# Patient Record
Sex: Female | Born: 1975 | Race: White | Hispanic: No | Marital: Married | Smoking: Current every day smoker
Health system: Southern US, Community
[De-identification: ages and names within clinical notes are randomized; demographics above are authoritative.]

## PROBLEM LIST (undated history)

## (undated) DIAGNOSIS — R51 Headache: Secondary | ICD-10-CM

---

## 2000-05-16 HISTORY — PX: ABDOMINAL HYSTERECTOMY: SHX81

## 2002-04-08 ENCOUNTER — Inpatient Hospital Stay (HOSPITAL_COMMUNITY): Admission: RE | Admit: 2002-04-08 | Discharge: 2002-04-10 | Payer: Self-pay | Admitting: Obstetrics and Gynecology

## 2002-10-04 ENCOUNTER — Ambulatory Visit (HOSPITAL_COMMUNITY): Admission: RE | Admit: 2002-10-04 | Discharge: 2002-10-04 | Payer: Self-pay | Admitting: Internal Medicine

## 2002-10-04 ENCOUNTER — Encounter: Payer: Self-pay | Admitting: Internal Medicine

## 2004-02-17 ENCOUNTER — Ambulatory Visit (HOSPITAL_COMMUNITY): Admission: RE | Admit: 2004-02-17 | Discharge: 2004-02-17 | Payer: Self-pay | Admitting: Preventative Medicine

## 2004-11-22 ENCOUNTER — Emergency Department (HOSPITAL_COMMUNITY): Admission: EM | Admit: 2004-11-22 | Discharge: 2004-11-22 | Payer: Self-pay | Admitting: Emergency Medicine

## 2004-11-25 ENCOUNTER — Inpatient Hospital Stay (HOSPITAL_COMMUNITY): Admission: AD | Admit: 2004-11-25 | Discharge: 2004-11-29 | Payer: Self-pay | Admitting: Internal Medicine

## 2004-11-28 ENCOUNTER — Ambulatory Visit: Payer: Self-pay | Admitting: Orthopedic Surgery

## 2004-12-24 ENCOUNTER — Ambulatory Visit (HOSPITAL_COMMUNITY): Admission: RE | Admit: 2004-12-24 | Discharge: 2004-12-24 | Payer: Self-pay | Admitting: Internal Medicine

## 2005-02-11 ENCOUNTER — Ambulatory Visit (HOSPITAL_COMMUNITY): Admission: RE | Admit: 2005-02-11 | Discharge: 2005-02-11 | Payer: Self-pay | Admitting: *Deleted

## 2005-10-13 ENCOUNTER — Ambulatory Visit: Payer: Self-pay | Admitting: Orthopedic Surgery

## 2005-10-19 ENCOUNTER — Encounter: Admission: RE | Admit: 2005-10-19 | Discharge: 2005-10-19 | Payer: Self-pay | Admitting: Orthopedic Surgery

## 2005-11-04 ENCOUNTER — Encounter: Admission: RE | Admit: 2005-11-04 | Discharge: 2005-11-04 | Payer: Self-pay | Admitting: Orthopedic Surgery

## 2009-12-31 ENCOUNTER — Emergency Department (HOSPITAL_COMMUNITY): Admission: EM | Admit: 2009-12-31 | Discharge: 2009-12-31 | Payer: Self-pay | Admitting: Emergency Medicine

## 2010-01-28 ENCOUNTER — Emergency Department (HOSPITAL_COMMUNITY): Admission: EM | Admit: 2010-01-28 | Discharge: 2010-01-29 | Payer: Self-pay | Admitting: Emergency Medicine

## 2010-06-06 ENCOUNTER — Encounter: Payer: Self-pay | Admitting: Orthopedic Surgery

## 2010-07-29 LAB — COMPREHENSIVE METABOLIC PANEL
ALT: 11 U/L (ref 0–35)
AST: 18 U/L (ref 0–37)
Albumin: 4.2 g/dL (ref 3.5–5.2)
Alkaline Phosphatase: 119 U/L — ABNORMAL HIGH (ref 39–117)
BUN: 8 mg/dL (ref 6–23)
CO2: 21 mEq/L (ref 19–32)
Calcium: 9.4 mg/dL (ref 8.4–10.5)
Chloride: 105 mEq/L (ref 96–112)
Creatinine, Ser: 0.77 mg/dL (ref 0.4–1.2)
GFR calc Af Amer: >60 mL/min (ref >60)
GFR calc non Af Amer: >60 mL/min (ref >60)
Glucose, Bld: 93 mg/dL (ref 70–99)
Potassium: 3.4 mEq/L — ABNORMAL LOW (ref 3.5–5.1)
Sodium: 138 mEq/L (ref 135–145)
Total Bilirubin: 0.9 mg/dL (ref 0.3–1.2)
Total Protein: 8.1 g/dL (ref 6.0–8.3)

## 2010-07-29 LAB — CBC
HCT: 41 % (ref 36.0–46.0)
Hemoglobin: 14.1 g/dL (ref 12.0–15.0)
MCH: 31.1 pg (ref 26.0–34.0)
MCHC: 34.5 g/dL (ref 30.0–36.0)
MCV: 90.3 fL (ref 78.0–100.0)
Platelets: 332 10*3/uL (ref 150–400)
RBC: 4.53 MIL/uL (ref 3.87–5.11)
RDW: 12.4 % (ref 11.5–15.5)
WBC: 14.7 10*3/uL — ABNORMAL HIGH (ref 4.0–10.5)

## 2010-07-29 LAB — D-DIMER, QUANTITATIVE: D-Dimer, Quant: 0.43 ug/mL-FEU (ref 0.00–0.48)

## 2010-07-29 LAB — POCT CARDIAC MARKERS: Myoglobin, poc: 67 ng/mL (ref 12–200)

## 2010-07-29 LAB — URINALYSIS, ROUTINE W REFLEX MICROSCOPIC
Bilirubin Urine: NEGATIVE
Glucose, UA: NEGATIVE mg/dL
Hgb urine dipstick: NEGATIVE
Nitrite: NEGATIVE
Protein, ur: NEGATIVE mg/dL
Specific Gravity, Urine: 1.015 (ref 1.005–1.030)
Urobilinogen, UA: 4 mg/dL — ABNORMAL HIGH (ref 0.0–1.0)
pH: 7 (ref 5.0–8.0)

## 2010-07-29 LAB — LACTIC ACID, PLASMA: Lactic Acid, Venous: 1.6 mmol/L (ref 0.5–2.2)

## 2010-07-29 LAB — DIFFERENTIAL
Basophils Absolute: 0.1 10*3/uL (ref 0.0–0.1)
Basophils Relative: 1 % (ref 0–1)
Eosinophils Absolute: 0 10*3/uL (ref 0.0–0.7)
Eosinophils Relative: 0 % (ref 0–5)
Lymphocytes Relative: 21 % (ref 12–46)
Lymphs Abs: 3.1 10*3/uL (ref 0.7–4.0)
Monocytes Absolute: 1 10*3/uL (ref 0.1–1.0)
Monocytes Relative: 7 % (ref 3–12)
Neutro Abs: 10.5 10*3/uL — ABNORMAL HIGH (ref 1.7–7.7)
Neutrophils Relative %: 71 % (ref 43–77)

## 2010-07-29 LAB — CULTURE, BLOOD (ROUTINE X 2)
Culture  Setup Time: 201109160136
Culture  Setup Time: 201109160137
Culture: NO GROWTH
Culture: NO GROWTH

## 2010-07-29 LAB — GLUCOSE, CAPILLARY: Glucose-Capillary: 103 mg/dL — ABNORMAL HIGH (ref 70–99)
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
## 2010-10-01 NOTE — Op Note (Signed)
NAMEEarl Jarvis                        ACCOUNT NO.:  [id number]   MEDICAL RECORD NO.:  [id number]                   PATIENT TYPE:  INP   LOCATION:  A417                                 FACILITY:  [facility]   PHYSICIAN:  Tilda Burrow, M.D.              DATE OF BIRTH:  1975/12/05   DATE OF PROCEDURE:  04/08/2002  DATE OF DISCHARGE:  04/10/2002                                 OPERATIVE REPORT   PREOPERATIVE DIAGNOSES:  1. Desire for elective permanent sterilization.  2. Dysmenorrhea.  3. Dyspareunia.  4. Cervical dysplasia.   POSTOPERATIVE DIAGNOSES:  1. Desire for elective permanent sterilization.  2. Dysmenorrhea.  3. Dyspareunia.  4. Cervical dysplasia.   PROCEDURE:  Vaginal hysterectomy.   SURGEON:  Christin Bach, M.D.   ASSISTANTSCurley Spice and Amie Critchley.   ANESTHESIA:  General   COMPLICATIONS:  None.   ESTIMATED BLOOD LOSS:  Less than 100 mL   FINDINGS:  Small uterus, no pelvic adhesions.   DETAILS OF PROCEDURE:  The patient was taken to the operating room and  prepped and draped int he usual fashion for vaginal procedure.  The legs  were elevated in cellophane candy cane support stirrups.  The posterior  vagina had a 3-inch weighted speculum then placed for visibility.  The  cervix was grasped with a Lahey thyroid tenaculum; the anterior cervical  vaginal fornix opened from 10 o'clock to 2 o'clock and the bladder elevated  off of the lower uterine segment.  The posterior colpotomy incision was then  performed followed by using a curved Zeppelin clamp to cross clamp the  uterosacral ligaments on each side with transection and #0 chromic suture  ligature performed followed by tagging of these pedicles.   The lower cardinal ligaments were then taken down, clamped, cut, and suture  ligated on each side without difficulty.  The upper cardinal ligaments were  similarly treated.  The vesicouterine reflection of peritoneum could be  identified at this time and  opened and then entered.  The bladder was  elevated upwards using a Deaver retractor.  We then marched up the broad  ligament on each side serially clamping, cutting, and suture ligating using  #0 chromic after Zeppelin clamps and the scissor transection.  The  uteroovarian ligament, the fallopian tube and round ligament were  incorporated in a pedicle on each side which was clamped, cut, and suture  ligated.   Inspection of the pelvis showed adequate hemostasis.  We then proceeded to  close the pelvic peritoneum.  Before we did so, we placed a 2-0 silk suture,  pulling the uterosacral ligaments together in the midline.  The peritoneum  was then reapproximated using pursestring 2-0 chromic and then we proceeded  with closure of the vaginal cuff, closing the vaginal cuff, using  interrupted 2-0 chromic sutures.  The patient tolerated the procedure well  and went to recovery room in good condition.  Tilda Burrow, M.D.    JVF/MEDQ  D:  06/08/2002  T:  06/09/2002  Job:  [id number]

## 2010-10-01 NOTE — Discharge Summary (Signed)
   NAMEEarl Jarvis                        ACCOUNT NO.:  [id number]   MEDICAL RECORD NO.:  [id number]                   PATIENT TYPE:  INP   LOCATION:  A417                                 FACILITY:  [facility]   PHYSICIAN:  Tilda Burrow, M.D.              DATE OF BIRTH:  [birth year]   DATE OF ADMISSION:  04/08/2002  DATE OF DISCHARGE:  04/10/2002                                 DISCHARGE SUMMARY   ADMITTING DIAGNOSES:  1. Dyspareunia.  2. Elective sterilization.  3. History of cervical dysplasia.   DISCHARGE DIAGNOSES:  1. Dyspareunia.  2. Elective sterilization.  3. History of cervical dysplasia.   PROCEDURE:  On 04/08/02 vaginal hysterectomy.   DISCHARGE MEDICATIONS:  1. Tylox one to two q.4h. p.r.n. pain.  2. Laxative of choice p.o. once daily.   FOLLOW UP:  Two weeks in our office.   HOSPITAL SUMMARY:   BRIEF HISTORY:  This 35 year old female is admitted after being referred  from our office for multiple problems.  She specifically complains of deep  thrust dyspareunia, occasional right lower quadrant discomfort as well, and  the patient requests sterilization and has a history of cervical  abnormality.  Given the multiple things involved, she has requested  hysterectomy after counseling.   ALLERGIES:  None known.   MEDICATIONS:  None.   HABITS:  Three-pack-per-day-cigarette addiction.  Rare alcohol use.   PHYSICAL EXAMINATION:  VITAL SIGNS:  Height 5 feet 1 inch, weight 127.  PELVIC:  Exam notable for multiparous introitus, short vagina and smooth  cervix, uterus normal size, shape, and contour but causing dyspareunia.   HOSPITAL COURSE:  The patient was admitted and underwent a hysterectomy in  an uncomplicated fashion with 100 mL blood loss.  The uterus weighed 50 g  and showed mild HPV effect on the cervix but otherwise normal uterus.  The  patient's postop bowel function return was slightly slowed.  She had a blood  type O positive, had an admission  hemoglobin of 14, hematocrit 41.2, and  postop values of 12.3 and 36.6, respectively.  She tolerated the recovery  time nicely over a 2-day period and was reported stable for discharge at  that time with followup scheduled for 2 weeks and then 4 weeks at our  office.                                               Tilda Burrow, M.D.    JVF/MEDQ  D:  06/08/2002  T:  06/09/2002  Job:  [id number]

## 2010-10-01 NOTE — Op Note (Signed)
NAMEEarl Jarvis                        ACCOUNT NO.:  [id number]   MEDICAL RECORD NO.:  [id number]                   PATIENT TYPE:  AMB   LOCATION:  DAY                                  FACILITY:  [facility]   PHYSICIAN:  Tilda Burrow, M.D.              DATE OF BIRTH:  10/02/1975   DATE OF PROCEDURE:  DATE OF DISCHARGE:                                 OPERATIVE REPORT   PREOPERATIVE DIAGNOSES:  Dyspareunia.  History of abnormal Pap.  Elective  sterilization.  Dysmenorrhea.   POSTOPERATIVE DIAGNOSES:  Dyspareunia.  History of abnormal Pap.  Elective  sterilization.  Dysmenorrhea.   PROCEDURE:  Vaginal hysterectomy, Doderlein technique.   SURGEON:  Tilda Burrow, M.D.   ASSISTANTMarland Kitchen  Rosalene Billings, C.S.T.   ANESTHESIA:  General.   COMPLICATIONS:  None.   FINDINGS:  Small uterus.  Mobile pelvic structures.  Short vaginal length.  Normal ovaries bilaterally with relaxed, supportive ovaries.   DETAILS OF PROCEDURE:  The patient was taken to the operating room and  prepped and draped for a vaginal procedure with legs supported with candy  cane stirrups, which were padded.  A weighted short speculum was placed in  the vagina, and a anterior colpotomy incision was performed, easily lifting  the bladder up.  A Doderlein technique was used with anterior flipping of  the uterus through the anterior colpotomy incision, such that the fundus  could be grasped with a Lahey thyroid tenaculum.  The round ligament and  fallopian tubes, along with the utero-ovarian ligament was grasped in a  pedicle with a curved Zeppelin clamp, a Kelly clamp placed to control back  bleeding, and the pedicle transected and ligated and tagged.  This was  performed bilaterally.  The broad ligament was then serially clamped, cut,  and suture ligated using 0 chromic.  The uterine vessels were cross clamped  on the third bite, clamped, cut, and suture ligated.  The lower cardinal  ligaments were clamped,  cut, and suture ligated, incorporating the  uterosacral ligaments and this pedicle.  The small area between the two  clamps was left free.  There was some oozing from the left uterine artery  which required reclamping and one additional suture on this side.  The  uterosacrals were then firmly pulled in the midline using a 0 silk suture,  grasping a portion of the uterosacral ligament on each side, pulling it in  the midline, being careful to remain sufficiently posterior to the uterine  vessel, but the ureters were not at risk.   The pursestring suture was placed from the anterior bladder flap peritoneum,  being careful not to incorporate the ovaries into the closure but to pull  anterior and posterior peritoneum together, followed by reapproximation of  uterosacral pedicles in the midline and then interrupted 2-0 chromic x2 to  reapproximate the cuff edges in the  midline.  Vaginal packing was placed, followed by  Foley catheter insertion  with a small amount of dark, concentrated urine.  The patient tolerated the  procedure well and went to recovery in good condition with estimated blood  loss less than 100 cc.                                               Tilda Burrow, M.D.    JVF/MEDQ  D:  04/08/2002  T:  04/08/2002  Job:  [id number]
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
## 2010-10-01 NOTE — H&P (Signed)
NAMEEarl Jarvis                        ACCOUNT NO.:  [id number]   MEDICAL RECORD NO.:  [id number]                  PATIENT TYPE:   LOCATION:                                       FACILITY:  [facility]   PHYSICIAN:  Valerie Jarvis, M.D.              DATE OF BIRTH:  1976-03-05   DATE OF ADMISSION:  04/08/2002  DATE OF DISCHARGE:                                HISTORY & PHYSICAL   ADMITTING DIAGNOSES:  1. Desire for elective permanent sterilization.  2. Dyspareunia.  3. History of cervical dysplasia, low-to-intermediate grade.   HISTORY OF PRESENT ILLNESS:  This is a 35 year old female, gravida 2, para  2, with children ages 80 and 35, who is admitted at this time for vaginal  hysterectomy.  The patient has complaints of deep-placed dyspareunia which  has progressively increased over the years and has been perceived as a  domestically significant problem since the birth of her last child.  She has  occasional right lower quadrant discomfort with intimacy.  The patient has  been seen and initially requested sterilization, but after review of  multiple symptoms and discussion of treatment options, the patient is  inclined to proceed toward hysterectomy.  She has been informed in oral and  in writing that a hysterectomy makes her permanently incapable of  childbearing and has signed appropriate Medicaid forms.   ALLERGIES:  No known drug allergies.   MEDICATIONS:  None.   HABITS:  Cigarettes:  Three-pack-per-day cigarette addiction.  Alcohol:  Rarely taken.   PAST MEDICAL HISTORY:  Pap smears performed at [facility] department since  colposcopy with mild-to-moderate dysplasia in March 1998, with the history  of vaginal delivery x2.   PHYSICAL EXAMINATION:  VITAL SIGNS:  Height 5 feet 1 inch.  Weight 127.  Blood pressure 120/70.  GENERAL:  General exam shows a cheerful, energetic Caucasian female, alert  and oriented x3.  HEENT:  Pupils equal, round and reactive.  NECK:  Neck  supple.  Trachea midline.  CHEST:  Chest clear to auscultation.  BREASTS:  Exam deferred.  CARDIAC:  Regular rate and rhythm.  LUNGS:  Clear.  ABDOMEN:  Abdomen scaphoid without masses or hepatosplenomegaly.  EXTREMITIES:  Extremities grossly normal.  PELVIC:  External genitalia:  Multiparous introitus.  Vaginal exam:  Short  vaginal length.  Cervix smooth, status post cryocautery.  Uterus normal  size, shape and contour.  Adnexa negative for masses and the uterus deviates  slightly to the right, suggesting some support defects, status post  childbearing.   ASSESSMENT:  1. Dyspareunia.  2. History of abnormal Pap.  3. Desire for elective sterilization.  4. Dysmenorrhea.   PLAN:  Vaginal hysterectomy with preservation of the ovaries on April 08, 2002.  Valerie Jarvis, M.D.    JVF/MEDQ  D:  04/03/2002  T:  04/04/2002  Job:  [id number]   cc:   Valerie Jarvis. Valerie Jarvis, M.D.  [address]  [place]  [place] [address]  Fax: [phone]
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
## 2012-07-29 ENCOUNTER — Encounter (HOSPITAL_COMMUNITY): Payer: Self-pay | Admitting: *Deleted

## 2012-07-29 ENCOUNTER — Emergency Department (HOSPITAL_COMMUNITY): Payer: BC Managed Care – PPO

## 2012-07-29 ENCOUNTER — Emergency Department (HOSPITAL_COMMUNITY)
Admission: EM | Admit: 2012-07-29 | Discharge: 2012-07-30 | Disposition: A | Discharge status: Home / Self Care | Payer: BC Managed Care – PPO | Attending: Emergency Medicine | Admitting: Emergency Medicine

## 2012-07-29 DIAGNOSIS — F17200 Nicotine dependence, unspecified, uncomplicated: Secondary | ICD-10-CM | POA: Insufficient documentation

## 2012-07-29 DIAGNOSIS — Y9389 Activity, other specified: Secondary | ICD-10-CM | POA: Insufficient documentation

## 2012-07-29 DIAGNOSIS — S62309A Unspecified fracture of unspecified metacarpal bone, initial encounter for closed fracture: Secondary | ICD-10-CM | POA: Insufficient documentation

## 2012-07-29 DIAGNOSIS — Y929 Unspecified place or not applicable: Secondary | ICD-10-CM | POA: Insufficient documentation

## 2012-07-29 DIAGNOSIS — IMO0002 Reserved for concepts with insufficient information to code with codable children: Secondary | ICD-10-CM | POA: Insufficient documentation

## 2012-07-29 DIAGNOSIS — S6291XA Unspecified fracture of right wrist and hand, initial encounter for closed fracture: Secondary | ICD-10-CM

## 2012-07-29 MED ORDER — IBUPROFEN 800 MG PO TABS
800.0000 mg | ORAL_TABLET | Freq: Once | ORAL | Status: AC
  Administered 2012-07-29: 800 mg via ORAL
  Filled 2012-07-29: qty 1

## 2012-07-29 MED ORDER — HYDROCODONE-ACETAMINOPHEN 5-325 MG PO TABS: 1.0000 | ORAL_TABLET | ORAL | Status: DC | PRN

## 2012-07-29 MED ORDER — IBUPROFEN 800 MG PO TABS: 800.0000 mg | ORAL_TABLET | Freq: Three times a day (TID) | ORAL | Status: DC

## 2012-07-29 MED ORDER — HYDROCODONE-ACETAMINOPHEN 5-325 MG PO TABS
1.0000 | ORAL_TABLET | Freq: Once | ORAL | Status: AC
  Administered 2012-07-29: 1 via ORAL
  Filled 2012-07-29: qty 1

## 2012-07-29 NOTE — ED Notes (Signed)
Pt punched a stud in the wall. Now c/o right hand pain.

## 2012-07-30 ENCOUNTER — Encounter: Payer: Self-pay | Admitting: Orthopedic Surgery

## 2012-07-30 ENCOUNTER — Ambulatory Visit (INDEPENDENT_AMBULATORY_CARE_PROVIDER_SITE_OTHER): Payer: BC Managed Care – PPO | Admitting: Orthopedic Surgery

## 2012-07-30 VITALS — BP 104/60 | Ht 61.0 in | Wt 152.0 lb

## 2012-07-30 DIAGNOSIS — S62309A Unspecified fracture of unspecified metacarpal bone, initial encounter for closed fracture: Secondary | ICD-10-CM | POA: Insufficient documentation

## 2012-07-30 MED ORDER — HYDROCODONE-ACETAMINOPHEN 5-325 MG PO TABS: 1.0000 | ORAL_TABLET | ORAL | Status: DC | PRN

## 2012-07-30 MED ORDER — IBUPROFEN 800 MG PO TABS: 800.0000 mg | ORAL_TABLET | Freq: Three times a day (TID) | ORAL | Status: DC

## 2012-07-30 NOTE — Progress Notes (Signed)
Patient ID: Valerie Jarvis, female   DOB: February 04, 1976, 37 y.o.   MRN: [id number] Chief Complaint  Patient presents with  . Hand Pain    Right hand pain d/t fracture DOI 07/29/12    BP 104/60  Ht 5\' 1"  (1.549 m)  Wt 152 lb (68.947 kg)  BMI 28.74 kg/m2  Patient history the patient had a wall secondary to frustration injured her right hand, date of injury March 16, complains of throbbing burning 8/10 pain over the right fifth metacarpal which she has fracture before. Pain is constant. She reports some numbness and tingling and swelling is noted as well. Pain is worse with motion better with she says nothing current medications include Norco and ibuprofen  Review of systems positive findings cough snoring numbness tingling denies skin changes denies joint pain  Employed as a landscaper  General appearance is normal, the patient is alert and oriented x3 with normal mood and affect. BP 104/60  Ht 5\' 1"  (1.549 m)  Wt 152 lb (68.947 kg)  BMI 28.74 kg/m2   Inspection reveals a swollen hand with an abrasion over the fourth metacarpal not near the joint. Range of motion is limited by pain joint stability confirmed by x-ray motor exam is deferred but muscle tone is normal skin abrasion is noted good pulse and sensation distally.  X-rays are reviewed my interpretation is that she has an old fracture with an acute fracture on top of it noted with the fifth metacarpal fracture at the neck so-called boxer's fracture  Recommend splint for 2 weeks then change splint to buddy taping for active range of motion  Metacarpal bone fracture, closed, initial encounter - Plan: ibuprofen (ADVIL,MOTRIN) 800 MG tablet, HYDROcodone-acetaminophen (NORCO/VICODIN) 5-325 MG per tablet

## 2012-07-30 NOTE — ED Provider Notes (Signed)
History     CSN: [id number]  Arrival date & time 07/29/12  2140   First MD Initiated Contact with Patient 07/29/12 2258      Chief Complaint  Patient presents with  . Hand Pain    (Consider location/radiation/quality/duration/timing/severity/associated sxs/prior treatment) HPI Valerie Jarvis is a 37 y.o. female who presents to the Emergency Department complaining of right hand pain and swelling after punching the wall.  History reviewed. No pertinent past medical history.  Past Surgical History  Procedure Laterality Date  . Abdominal hysterectomy      History reviewed. No pertinent family history.  History  Substance Use Topics  . Smoking status: Current Every Day Smoker  . Smokeless tobacco: Not on file  . Alcohol Use: No    OB History   Grav Para Term Preterm Abortions TAB SAB Ect Mult Living                  Review of Systems  Constitutional: Negative for fever.       10 Systems reviewed and are negative for acute change except as noted in the HPI.  HENT: Negative for congestion.   Eyes: Negative for discharge and redness.  Respiratory: Negative for cough and shortness of breath.   Cardiovascular: Negative for chest pain.  Gastrointestinal: Negative for vomiting and abdominal pain.  Musculoskeletal: Negative for back pain.  Skin: Negative for rash.  Neurological: Negative for syncope, numbness and headaches.  Psychiatric/Behavioral:       No behavior change.    Allergies  Review of patient's allergies indicates no known allergies.  Home Medications   Current Outpatient Rx  Name  Route  Sig  Dispense  Refill  . HYDROcodone-acetaminophen (NORCO/VICODIN) 5-325 MG per tablet   Oral   Take 1 tablet by mouth every 4 (four) hours as needed for pain.   10 tablet   0   . ibuprofen (ADVIL,MOTRIN) 800 MG tablet   Oral   Take 1 tablet (800 mg total) by mouth 3 (three) times daily.   21 tablet   0     BP 119/85  Pulse 74  Temp(Src) 97.9 F (36.6  C) (Oral)  Resp 20  Ht 5\' 1"  (1.549 m)  Wt 145 lb (65.772 kg)  BMI 27.41 kg/m2  SpO2 100%  Physical Exam  Nursing note and vitals reviewed. Constitutional: She appears well-developed and well-nourished.  Awake, alert, nontoxic appearance.  HENT:  Head: Normocephalic and atraumatic.  Right Ear: External ear normal.  Left Ear: External ear normal.  Mouth/Throat: Oropharynx is clear and moist.  Eyes: EOM are normal. Pupils are equal, round, and reactive to light. Right eye exhibits no discharge. Left eye exhibits no discharge.  Neck: Normal range of motion. Neck supple.  Cardiovascular: Normal rate and intact distal pulses.   Pulmonary/Chest: Effort normal and breath sounds normal. She exhibits no tenderness.  Abdominal: Soft. Bowel sounds are normal. There is no tenderness. There is no rebound.  Musculoskeletal: She exhibits no tenderness.  Baseline ROM, no obvious new focal weakness.right hand with swelling over the dorsum of the hand and swelling and deformity to the 5th MCP  Neurological:  Mental status and motor strength appears baseline for patient and situation.  Skin: No rash noted.  Psychiatric: She has a normal mood and affect.    ED Course  Procedures (including critical care time)  Dg Hand Complete Right  07/29/2012  *RADIOLOGY REPORT*  Clinical Data: Pain and swelling in the fifth digit after injury.  RIGHT HAND - COMPLETE 3+ VIEW  Comparison: None.  Findings: There is acute appearing fracture with volar angulation involving the distal aspect of the right fifth metacarpal bone. There is mild soft tissue swelling.  No additional fractures are demonstrated.  No focal bone lesion or bone destruction.  No radiopaque soft tissue foreign bodies.  IMPRESSION: Acute appearing fracture of the distal right fifth metacarpal bone with volar angulation.   Original Report Authenticated By: Burman Nieves, M.D.      1. Hand fracture, right, closed, initial encounter       MDM   Patient with swelling and pain to the right hand s/p punching the wall with her hand. Xrays reveal a 5th MCP fracture. She was given ibuprofen and hydrocodone and placed in an ulnar gutter splint. Referral to orthopedics. Pt stable in ED with no significant deterioration in condition.The patient appears reasonably screened and/or stabilized for discharge and I doubt any other medical condition or other [facility] requiring further screening, evaluation, or treatment in the ED at this time prior to discharge.  MDM Number of Diagnoses or Management Options Hand fracture, right, closed, initial encounter:  MDM Reviewed: nursing note and vitals Interpretation: x-ray           Nicoletta Dress. Colon Branch, MD 07/30/12 [id number]

## 2012-07-30 NOTE — Patient Instructions (Addendum)
Return in 2 weeks for splint change   No work notes needed

## 2012-08-13 ENCOUNTER — Ambulatory Visit (INDEPENDENT_AMBULATORY_CARE_PROVIDER_SITE_OTHER): Payer: BC Managed Care – PPO | Admitting: Orthopedic Surgery

## 2012-08-13 VITALS — BP 102/60 | Ht 61.0 in | Wt 152.0 lb

## 2012-08-13 DIAGNOSIS — S62309D Unspecified fracture of unspecified metacarpal bone, subsequent encounter for fracture with routine healing: Secondary | ICD-10-CM

## 2012-08-13 DIAGNOSIS — S5290XD Unspecified fracture of unspecified forearm, subsequent encounter for closed fracture with routine healing: Secondary | ICD-10-CM

## 2012-08-13 MED ORDER — PROMETHAZINE HCL 12.5 MG PO TABS: 12.5000 mg | ORAL_TABLET | ORAL | Status: DC | PRN

## 2012-08-13 NOTE — Patient Instructions (Addendum)
Keep tape on x 3 weeks then remove   Start moving injured joint as tolerated immediately

## 2012-08-13 NOTE — Progress Notes (Signed)
Patient ID: Valerie Jarvis, female   DOB: [birth year], 37 y.o.   MRN: [id number] Chief Complaint  Patient presents with  . Follow-up    2 week recheck right hand fracture with splint change, DOI 07-29-12.    BP 102/60  Ht 5\' 1"  (1.549 m)  Wt 152 lb (68.947 kg)  BMI 28.74 kg/m2  Status post fracture to the right fifth metacarpal doctors type fracture treated with splinting comes in for reexamination. Rotatory alignment is normal angular alignment is normal buddy taping of the fingers is now our treatment we will x-ray in 4 weeks she is encouraged to start active range of motion of the metacarpophalangeal and interphalangeal joints

## 2012-09-11 ENCOUNTER — Encounter: Payer: Self-pay | Admitting: Orthopedic Surgery

## 2012-09-11 ENCOUNTER — Ambulatory Visit (INDEPENDENT_AMBULATORY_CARE_PROVIDER_SITE_OTHER): Payer: BC Managed Care – PPO

## 2012-09-11 ENCOUNTER — Ambulatory Visit (INDEPENDENT_AMBULATORY_CARE_PROVIDER_SITE_OTHER): Payer: BC Managed Care – PPO | Admitting: Orthopedic Surgery

## 2012-09-11 VITALS — BP 104/70 | Ht 61.0 in | Wt 152.0 lb

## 2012-09-11 DIAGNOSIS — M6530 Trigger finger, unspecified finger: Secondary | ICD-10-CM

## 2012-09-11 DIAGNOSIS — S62609A Fracture of unspecified phalanx of unspecified finger, initial encounter for closed fracture: Secondary | ICD-10-CM

## 2012-09-11 NOTE — Patient Instructions (Signed)
Start aggressive range of motion exercises by trying to make a fist  You should notice improvement over the next 2-3 months  Followup with [place] as needed

## 2012-09-11 NOTE — Progress Notes (Signed)
Patient ID: Valerie Jarvis, female   DOB: 1976/05/11, 37 y.o.   MRN: [id number] Chief Complaint  Patient presents with  . Follow-up    follow up right hand fracture DOI 07/29/12   History this is a 37 year old female comes in for right fifth metacarpal boxer's fracture. 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  Her right hand has improved she's regained most of her range of motion in terms of her fracture.  General appearance is normal, the patient is alert and oriented x3 with normal mood and affect. BP 104/70  Ht 5\' 1"  (1.549 m)  Wt 152 lb (68.947 kg)  BMI 28.74 kg/m2  She has full range of motion of the right hand no tenderness over the fracture she has a clicking popping phenomenon with tenderness over the A1 pulley regarding the right long finger  We discussed injection she agreed to injection  Procedure injection right long finger trigger finger  Medications Medrol 40 mg Lidocaine 1% 1 cc   she gave verbal consent and timeout was completed  A 25-gauge needle was used to inject the A1 pulley and tendon sheath   No complications were noted.  The patient will return if the triggering doesn't stop to consider trigger finger release   The fracture has healed   I have interpreted the x-ray today as a healed fifth metacarpal fracture with minimal to no angulation

## 2012-09-24 ENCOUNTER — Telehealth: Payer: Self-pay | Admitting: Orthopedic Surgery

## 2012-09-24 NOTE — Telephone Encounter (Signed)
Valerie Jarvis said her finger is no better since the injection 2 weeks ago and said you mentioned surgery if the injection did not help. Please advise Her # [phone]

## 2012-09-25 ENCOUNTER — Other Ambulatory Visit: Payer: Self-pay | Admitting: *Deleted

## 2012-09-25 NOTE — Telephone Encounter (Signed)
Confirm finger to be operated on  And schedule for trigger finger release FRI

## 2012-09-25 NOTE — Telephone Encounter (Signed)
Left message for patient to return call.

## 2012-09-26 ENCOUNTER — Telehealth: Payer: Self-pay | Admitting: Orthopedic Surgery

## 2012-09-26 NOTE — Telephone Encounter (Signed)
Scheduled for surgery 09/28/12. Patient aware of dates and times.

## 2012-09-26 NOTE — Telephone Encounter (Addendum)
Called insurer,[employer], ph# [phone], regarding out-patient surgery scheduled 09/28/12, at [facility], CPT [phone], ICD-9 code 354.0. * And, CPT [id number], ICD-9 code 727.03,816.00. Per [name], no pre-authorization is required; her name, today's date, 09/26/12, 5:49p.m.

## 2012-09-27 ENCOUNTER — Encounter (HOSPITAL_COMMUNITY)
Admission: RE | Admit: 2012-09-27 | Discharge: 2012-09-27 | Disposition: A | Discharge status: Home / Self Care | Payer: BC Managed Care – PPO | Source: Ambulatory Visit | Attending: Orthopedic Surgery | Admitting: Orthopedic Surgery

## 2012-09-27 ENCOUNTER — Encounter (HOSPITAL_COMMUNITY): Payer: Self-pay

## 2012-09-27 ENCOUNTER — Encounter (HOSPITAL_COMMUNITY): Payer: Self-pay | Admitting: Pharmacy Technician

## 2012-09-27 HISTORY — DX: Headache: R51

## 2012-09-27 LAB — BASIC METABOLIC PANEL
CO2: 26 mEq/L (ref 19–32)
Chloride: 104 mEq/L (ref 96–112)
Glucose, Bld: 68 mg/dL — ABNORMAL LOW (ref 70–99)
Sodium: 141 mEq/L (ref 135–145)

## 2012-09-27 LAB — SURGICAL PCR SCREEN
MRSA, PCR: NEGATIVE
Staphylococcus aureus: NEGATIVE

## 2012-09-27 LAB — HEMOGLOBIN AND HEMATOCRIT, BLOOD: HCT: 40.5 % (ref 36.0–46.0)

## 2012-09-27 MED ORDER — CEFAZOLIN SODIUM-DEXTROSE 2-3 GM-% IV SOLR: 2.0000 g | INTRAVENOUS | Status: DC

## 2012-09-27 MED ORDER — CHLORHEXIDINE GLUCONATE 4 % EX LIQD: 60.0000 mL | Freq: Once | CUTANEOUS | Status: DC

## 2012-09-27 NOTE — H&P (Signed)
  History and physical for right long finger trigger release  Chief complaint catching pain locking right long finger  History 37 year-old female with a long history of triggering and locking of the right long finger. She works as a [employer] fast streamer. She received an injection in the A1 pulley did not improve and opted for surgical release.  Review of systems recent boxer's fracture healed well with good functional return  Past Medical History  Diagnosis Date  . Headache    Past Surgical History  Procedure Laterality Date  . Abdominal hysterectomy     Family History  Problem Relation Age of Onset  . Heart disease    . Arthritis    . Cancer    . Asthma    . Diabetes     History  Substance Use Topics  . Smoking status: Current Every Day Smoker -- 1.00 packs/day for 27 years    Types: Cigarettes  . Smokeless tobacco: Not on file  . Alcohol Use: No   History  Substance Use Topics  . Smoking status: Current Every Day Smoker -- 1.00 packs/day for 27 years    Types: Cigarettes  . Smokeless tobacco: Not on file  . Alcohol Use: No    Vital signs have been stable in my office. Excellent general appearance normal development nutrition grooming hygiene no deformities. Cardiovascular findings include normal pulse and perfusion right upper extremity. No lymph nodes in the epitrochlear region. Ambulation is normal. Inspection reveals tenderness over the A1 pulley with catching and locking right long finger range of motion is normal stability is normal there is no atrophy in the hand. The skin is intact. Mental status is normal mood is normal. Sensation is normal.  Right long finger trigger phenomenon  Recommend open right long finger trigger release of the A1 pulley

## 2012-09-27 NOTE — Patient Instructions (Addendum)
Valerie Jarvis  09/27/2012   Your procedure is scheduled on:  09/28/2012  Report to [facility] at  830  AM.  Call this number if you have problems the morning of surgery: [phone]   Remember:   Do not eat food or drink liquids after midnight.   Take these medicines the morning of surgery with A SIP OF WATER: norco,phenregan   Do not wear jewelry, make-up or nail polish.  Do not wear lotions, powders, or perfumes.   Do not shave 48 hours prior to surgery. Men may shave face and neck.  Do not bring valuables to the hospital.  Contacts, dentures or bridgework may not be worn into surgery.  Leave suitcase in the car. After surgery it may be brought to your room.  For patients admitted to the hospital, checkout time is 11:00 AM the day of discharge.   Patients discharged the day of surgery will not be allowed to drive  home.  Name and phone number of your driver: family  Special Instructions: Shower using CHG 2 nights before surgery and the night before surgery.  If you shower the day of surgery use CHG.  Use special wash - you have one bottle of CHG for all showers.  You should use approximately 1/3 of the bottle for each shower.   Please read over the following fact sheets that you were given: Pain Booklet, Coughing and Deep Breathing, MRSA Information, Surgical Site Infection Prevention, Anesthesia Post-op Instructions and Care and Recovery After Surgery Trigger Finger Trigger finger (digital tendinitis and stenosing tenosynovitis) is a common disorder that causes an often painful catching of the fingers or thumb. It occurs as a clicking, snapping or locking of a finger in the palm of the hand. The reason for this is that there is a problem with the tendons which flex the fingers sliding smoothly through their sheaths. The cause of this may be inflammation of the tendon and sheath, or from a thickening or nodule in the tendon. The condition may occur in any finger or a couple  fingers at the same time. The cause may be overuse while doing the same activity over and over again with your hands.  Tendons are the tough cords that connect the muscles to bones. Muscles and tendons are part of the system which allows your body to move. When muscles contract in the forearm on the palm side, they pull the tendons toward the elbow and cause the fingers and thumb to bend (flex) toward the palm. These are the flexor tendons. The tendons slide through a slippery smooth membrane (synovium) which is called the tendon sheath. The sheaths have areas of tough fibrous tissues surrounding them which hold the tendons close to the bone. These are called pulleys because they work like a pulley. The first pulley is in the palm of the hand near the crease which runs across your palm. If the area of the tendon thickening is near the pulley, the tendon cannot slide smoothly through the pulley and this causes the trigger finger. The finger may lock with the finger curled or suddenly straighten out with a snap. This is more common in patients with rheumatoid arthritis and diabetes. Left untreated, the condition may get worse to the point where the finger becomes locked in flexion, like making a fist, or less commonly locked with the finger straightened out. DIAGNOSIS  Your caregiver will easily make this diagnosis on examination. TREATMENT   Splinting for 6  to 8 weeks of time may be helpful. Use the splints as your caregiver suggests.  Heat used for twenty minutes at least four times a day followed by ice packs for twenty minutes unless directed otherwise by your caregiver may be helpful. If you find either heat or cold seems to be making the problem worse, quit using them and ask your caregiver for directions.  Cortisone injections along with splinting may speed up recovery. Several injections may be required. Cortisone may give relief after one injection.  Only take over-the-counter or prescription  medicines for pain, discomfort, or fever as directed by your caregiver.  Surgery is another treatment that may be used if conservative treatments using injection and splinting does not work. Surgery can be minor without incisions (a cut does not have to be made) and can be done with a needle through the skin. No stitches are needed and most patients may return to work the same day.  Other surgical choices involve an open procedure where the surgeon opens the hand through a small incision (cut) and cuts the pulley so the tendon can again slide smoothly. Your hand will still work fine. This small operation requires stitches and the recovery will be a little longer and the incisions will need to be protected until completely healed. You may have to limit your activities for up to 6 months.  Occupational or hand therapy may be required if there is stiffness remaining in the finger. RISKS AND COMPLICATIONS Complications are uncommon but some problems that may occur are:  Recurrence of the trigger finger. This does not mean that the surgery was not well done. It simply means that you may have formed scar tissue following surgery that causes the problem to reoccur.  Infection which could ruin the results of the surgery and can result in a finger which is frozen and can not move normally.  Nerve injury is possible which could result in permanent numbness of one or more fingers. CARE AFTER SURGERY  Elevate your hand above your heart and use ice as instructed.  Follow instructions regarding finger motion/exercise.  Keep the surgical wound dry for at least 48 hrs or longer if instructed.  Keep your follow-up appointments.  Return to work and normal activities as instructed. SEEK IMMEDIATE MEDICAL CARE IF:  Your problems are getting worse or you do not obtain relief from the treatment. Document Released: 02/20/2004 Document Revised: 07/25/2011 Document Reviewed: 10/14/2008 [facility] Patient  Information 2013 [place], [place]. PATIENT INSTRUCTIONS POST-ANESTHESIA  IMMEDIATELY FOLLOWING SURGERY:  Do not drive or operate machinery for the first twenty four hours after surgery.  Do not make any important decisions for twenty four hours after surgery or while taking narcotic pain medications or sedatives.  If you develop intractable nausea and vomiting or a severe headache please notify your doctor immediately.  FOLLOW-UP:  Please make an appointment with your surgeon as instructed. You do not need to follow up with anesthesia unless specifically instructed to do so.  WOUND CARE INSTRUCTIONS (if applicable):  Keep a dry clean dressing on the anesthesia/puncture wound site if there is drainage.  Once the wound has quit draining you may leave it open to air.  Generally you should leave the bandage intact for twenty four hours unless there is drainage.  If the epidural site drains for more than 36-48 hours please call the anesthesia department.  QUESTIONS?:  Please feel free to call your physician or the hospital operator if you have any questions, and they  will be happy to assist you.

## 2012-09-28 ENCOUNTER — Encounter (HOSPITAL_COMMUNITY): Payer: Self-pay | Admitting: *Deleted

## 2012-09-28 ENCOUNTER — Ambulatory Visit (HOSPITAL_COMMUNITY): Payer: BC Managed Care – PPO | Admitting: Anesthesiology

## 2012-09-28 ENCOUNTER — Encounter (HOSPITAL_COMMUNITY): Payer: Self-pay | Admitting: Anesthesiology

## 2012-09-28 ENCOUNTER — Encounter (HOSPITAL_COMMUNITY)
Admission: RE | Disposition: A | Discharge status: Home / Self Care | Payer: Self-pay | Source: Ambulatory Visit | Attending: Orthopedic Surgery

## 2012-09-28 ENCOUNTER — Ambulatory Visit (HOSPITAL_COMMUNITY)
Admission: RE | Admit: 2012-09-28 | Discharge: 2012-09-28 | Disposition: A | Discharge status: Home / Self Care | Payer: BC Managed Care – PPO | Source: Ambulatory Visit | Attending: Orthopedic Surgery | Admitting: Orthopedic Surgery

## 2012-09-28 DIAGNOSIS — G709 Myoneural disorder, unspecified: Secondary | ICD-10-CM | POA: Insufficient documentation

## 2012-09-28 DIAGNOSIS — F17200 Nicotine dependence, unspecified, uncomplicated: Secondary | ICD-10-CM | POA: Insufficient documentation

## 2012-09-28 DIAGNOSIS — M6530 Trigger finger, unspecified finger: Secondary | ICD-10-CM | POA: Insufficient documentation

## 2012-09-28 DIAGNOSIS — S62309A Unspecified fracture of unspecified metacarpal bone, initial encounter for closed fracture: Secondary | ICD-10-CM

## 2012-09-28 DIAGNOSIS — R51 Headache: Secondary | ICD-10-CM | POA: Insufficient documentation

## 2012-09-28 HISTORY — PX: TRIGGER FINGER RELEASE: SHX641

## 2012-09-28 SURGERY — RELEASE, A1 PULLEY, FOR TRIGGER FINGER
Anesthesia: Regional | Site: Finger | Laterality: Right | Wound class: Clean

## 2012-09-28 MED ORDER — LACTATED RINGERS IV SOLN
INTRAVENOUS | Status: DC
  Administered 2012-09-28: 10:00:00 via INTRAVENOUS

## 2012-09-28 MED ORDER — SODIUM CHLORIDE 0.9 % IR SOLN
Status: DC | PRN
  Administered 2012-09-28: 50 mL

## 2012-09-28 MED ORDER — MIDAZOLAM HCL 2 MG/2ML IJ SOLN
1.0000 mg | INTRAMUSCULAR | Status: DC | PRN
  Administered 2012-09-28: 2 mg via INTRAVENOUS

## 2012-09-28 MED ORDER — CEFAZOLIN SODIUM-DEXTROSE 2-3 GM-% IV SOLR
INTRAVENOUS | Status: AC
  Filled 2012-09-28: qty 50

## 2012-09-28 MED ORDER — HYDROCODONE-ACETAMINOPHEN 5-325 MG PO TABS: 1.0000 | ORAL_TABLET | ORAL | Status: DC | PRN

## 2012-09-28 MED ORDER — FENTANYL CITRATE 0.05 MG/ML IJ SOLN
INTRAMUSCULAR | Status: DC | PRN
  Administered 2012-09-28 (×2): 50 ug via INTRAVENOUS

## 2012-09-28 MED ORDER — MIDAZOLAM HCL 5 MG/5ML IJ SOLN
INTRAMUSCULAR | Status: DC | PRN
  Administered 2012-09-28: 2 mg via INTRAVENOUS

## 2012-09-28 MED ORDER — BUPIVACAINE HCL (PF) 0.5 % IJ SOLN
INTRAMUSCULAR | Status: DC | PRN
  Administered 2012-09-28: 10 mL

## 2012-09-28 MED ORDER — FENTANYL CITRATE 0.05 MG/ML IJ SOLN
25.0000 ug | INTRAMUSCULAR | Status: DC | PRN
  Administered 2012-09-28 (×2): 50 ug via INTRAVENOUS

## 2012-09-28 MED ORDER — FENTANYL CITRATE 0.05 MG/ML IJ SOLN
INTRAMUSCULAR | Status: AC
  Filled 2012-09-28: qty 5

## 2012-09-28 MED ORDER — MIDAZOLAM HCL 2 MG/2ML IJ SOLN
INTRAMUSCULAR | Status: AC
  Filled 2012-09-28: qty 2

## 2012-09-28 MED ORDER — PROPOFOL INFUSION 10 MG/ML OPTIME
INTRAVENOUS | Status: DC | PRN
  Administered 2012-09-28: 35 ug/kg/min via INTRAVENOUS

## 2012-09-28 MED ORDER — BUPIVACAINE HCL (PF) 0.5 % IJ SOLN
INTRAMUSCULAR | Status: AC
  Filled 2012-09-28: qty 30

## 2012-09-28 MED ORDER — LIDOCAINE HCL (PF) 0.5 % IJ SOLN
INTRAMUSCULAR | Status: DC | PRN
  Administered 2012-09-28: 250 mg via INTRAVENOUS

## 2012-09-28 MED ORDER — FENTANYL CITRATE 0.05 MG/ML IJ SOLN
INTRAMUSCULAR | Status: AC
  Filled 2012-09-28: qty 2

## 2012-09-28 MED ORDER — SODIUM CHLORIDE 0.9 % IJ SOLN
INTRAMUSCULAR | Status: AC
  Filled 2012-09-28: qty 10

## 2012-09-28 MED ORDER — CEFAZOLIN SODIUM-DEXTROSE 2-3 GM-% IV SOLR
2.0000 g | Freq: Once | INTRAVENOUS | Status: AC
  Administered 2012-09-28: 2 g via INTRAVENOUS

## 2012-09-28 MED ORDER — ONDANSETRON HCL 4 MG/2ML IJ SOLN: 4.0000 mg | Freq: Once | INTRAMUSCULAR | Status: DC | PRN

## 2012-09-28 SURGICAL SUPPLY — 33 items
BAG HAMPER (MISCELLANEOUS) ×2 IMPLANT
BANDAGE CONFORM 2  STR LF (GAUZE/BANDAGES/DRESSINGS) ×2 IMPLANT
BANDAGE ELASTIC 2 VELCRO NS LF (GAUZE/BANDAGES/DRESSINGS) ×2 IMPLANT
BANDAGE ESMARK 4X12 BL STRL LF (DISPOSABLE) ×1 IMPLANT
BNDG ESMARK 4X12 BLUE STRL LF (DISPOSABLE) ×2
CHLORAPREP W/TINT 26ML (MISCELLANEOUS) ×2 IMPLANT
CLOTH BEACON ORANGE TIMEOUT ST (SAFETY) ×2 IMPLANT
COVER LIGHT HANDLE STERIS (MISCELLANEOUS) ×4 IMPLANT
CUFF TOURNIQUET SINGLE 18IN (TOURNIQUET CUFF) ×2 IMPLANT
DECANTER SPIKE VIAL GLASS SM (MISCELLANEOUS) ×2 IMPLANT
DRSG XEROFORM 1X8 (GAUZE/BANDAGES/DRESSINGS) ×2 IMPLANT
ELECT NEEDLE TIP 2.8 STRL (NEEDLE) ×2 IMPLANT
ELECT REM PT RETURN 9FT ADLT (ELECTROSURGICAL) ×2
ELECTRODE REM PT RTRN 9FT ADLT (ELECTROSURGICAL) ×1 IMPLANT
GLOVE BIOGEL PI IND STRL 7.0 (GLOVE) ×2 IMPLANT
GLOVE BIOGEL PI INDICATOR 7.0 (GLOVE) ×2
GLOVE SKINSENSE NS SZ8.0 LF (GLOVE) ×1
GLOVE SKINSENSE STRL SZ8.0 LF (GLOVE) ×1 IMPLANT
GLOVE SS BIOGEL STRL SZ 6.5 (GLOVE) ×1 IMPLANT
GLOVE SS N UNI LF 8.5 STRL (GLOVE) ×2 IMPLANT
GLOVE SUPERSENSE BIOGEL SZ 6.5 (GLOVE) ×1
GOWN STRL REIN XL XLG (GOWN DISPOSABLE) ×4 IMPLANT
HAND ALUMI XLG (SOFTGOODS) ×2 IMPLANT
KIT ROOM TURNOVER APOR (KITS) ×2 IMPLANT
MANIFOLD NEPTUNE II (INSTRUMENTS) ×2 IMPLANT
NEEDLE HYPO 21X1.5 SAFETY (NEEDLE) ×2 IMPLANT
NS IRRIG 1000ML POUR BTL (IV SOLUTION) ×2 IMPLANT
PACK BASIC LIMB (CUSTOM PROCEDURE TRAY) ×2 IMPLANT
PAD ARMBOARD 7.5X6 YLW CONV (MISCELLANEOUS) ×2 IMPLANT
SET BASIN LINEN APH (SET/KITS/TRAYS/PACK) ×2 IMPLANT
SPONGE GAUZE 2X2 8PLY STRL LF (GAUZE/BANDAGES/DRESSINGS) ×2 IMPLANT
SUT ETHILON 3 0 FSL (SUTURE) ×2 IMPLANT
SYR CONTROL 10ML LL (SYRINGE) ×2 IMPLANT

## 2012-09-28 NOTE — Transfer of Care (Signed)
Immediate Anesthesia Transfer of Care Note  Patient: Valerie Jarvis  Procedure(s) Performed: Procedure(s) with comments: RELEASE TRIGGER FINGER RIGHT LONG FINGER (Right) - Right Long Finger  Patient Location: PACU  Anesthesia Type:Bier block  Level of Consciousness: awake, alert  and oriented  Airway & Oxygen Therapy: Patient Spontanous Breathing and Patient connected to nasal cannula oxygen  Post-op Assessment: Report given to PACU RN  Post vital signs: Reviewed and stable  Complications: No apparent anesthesia complications

## 2012-09-28 NOTE — Anesthesia Postprocedure Evaluation (Signed)
  Anesthesia Post-op Note  Patient: Valerie Jarvis  Procedure(s) Performed: Procedure(s) with comments: RELEASE TRIGGER FINGER RIGHT LONG FINGER (Right) - Right Long Finger  Patient Location: PACU  Anesthesia Type:Bier block  Level of Consciousness: awake, alert  and oriented  Airway and Oxygen Therapy: Patient Spontanous Breathing and Patient connected to nasal cannula oxygen  Post-op Pain: none  Post-op Assessment: Post-op Vital signs reviewed, Patient's Cardiovascular Status Stable, Respiratory Function Stable, Patent Airway and No signs of Nausea or vomiting  Post-op Vital Signs: Reviewed and stable  Complications: No apparent anesthesia complications

## 2012-09-28 NOTE — Brief Op Note (Signed)
09/28/2012  11:49 AM  PATIENT:  Valerie Jarvis  37 y.o. female  PRE-OPERATIVE DIAGNOSIS:  trigger finger right long  POST-OPERATIVE DIAGNOSIS:  trigger finger right long  PROCEDURE:  Procedure(s) with comments: RELEASE TRIGGER FINGER RIGHT LONG FINGER (Right) - Right Long Finger  Dictation The patient identified in the preop holding area as Valerie Jarvis. Right long finger was marked the surgical site and By the Surgeon. She Was Reviewed and Updated Consent Was Then Signed a Day. The Patient Was Taken to the Operating Room Given Appropriate Antibiotics. After Sterile Prep and Draped in Timeout Longitudinal Incision Was Made over the Right Long Finger. Subcutaneous Tissue Was Bluntly Dissected down to the Flexor Tendon. The Proximal and Distal Aspects of the A1 Pulley Were Identified upon Admission It Was Passed beneath the Pulley and the A1 Pulley Was Released. The finger was flexed and extended each tendon was identified by pulling on it to check excursion. This was normal.  We was irrigated and closed with horizontal mattress sutures and injected with 10 cc of none medication.    SURGEON:  Surgeon(s) and Role:    * Anique Beckley E Westley Blass, MD - Primary  PHYSICIAN ASSISTANT:   ASSISTANTS: none   ANESTHESIA:   regional  EBL:  Total I/O In: 500 [I.V.:500] Out: 0   BLOOD ADMINISTERED:none  DRAINS: none   LOCAL MEDICATIONS USED:  MARCAINE    and Amount: 10 ml  SPECIMEN:  No Specimen  DISPOSITION OF SPECIMEN:  N/A  COUNTS:  YES  TOURNIQUET:   Total Tourniquet Time Documented: Upper Arm (Right) - 30 minutes Total: Upper Arm (Right) - 30 minutes   DICTATION: .Dragon Dictation  PLAN OF CARE: Discharge to home after PACU  PATIENT DISPOSITION:  PACU - hemodynamically stable.   Delay start of Pharmacological VTE agent (>24hrs) due to surgical blood loss or risk of bleeding: not applicable  

## 2012-09-28 NOTE — Op Note (Signed)
09/28/2012  11:49 AM  PATIENT:  Valerie Jarvis  37 y.o. female  PRE-OPERATIVE DIAGNOSIS:  trigger finger right long  POST-OPERATIVE DIAGNOSIS:  trigger finger right long  PROCEDURE:  Procedure(s) with comments: RELEASE TRIGGER FINGER RIGHT LONG FINGER (Right) - Right Long Finger  Dictation The patient identified in the preop holding area as Valerie Jarvis. Right long finger was marked the surgical site and By the Surgeon. She Was Reviewed and Updated Consent Was Then Signed a Day. The Patient Was Taken to the Operating Room Given Appropriate Antibiotics. After Sterile Prep and Draped in Timeout Longitudinal Incision Was Made over the Right Long Finger. Subcutaneous Tissue Was Bluntly Dissected down to the Flexor Tendon. The Proximal and Distal Aspects of the A1 Pulley Were Identified upon Admission It Was Passed beneath the Pulley and the A1 Pulley Was Released. The finger was flexed and extended each tendon was identified by pulling on it to check excursion. This was normal.  We was irrigated and closed with horizontal mattress sutures and injected with 10 cc of none medication.    SURGEON:  Surgeon(s) and Role:    * Vickki Hearing, MD - Primary  PHYSICIAN ASSISTANT:   ASSISTANTS: none   ANESTHESIA:   regional  EBL:  Total I/O In: 500 [I.V.:500] Out: 0   BLOOD ADMINISTERED:none  DRAINS: none   LOCAL MEDICATIONS USED:  MARCAINE    and Amount: 10 ml  SPECIMEN:  No Specimen  DISPOSITION OF SPECIMEN:  N/A  COUNTS:  YES  TOURNIQUET:   Total Tourniquet Time Documented: Upper Arm (Right) - 30 minutes Total: Upper Arm (Right) - 30 minutes   DICTATION: .Reubin Milan Dictation  PLAN OF CARE: Discharge to home after PACU  PATIENT DISPOSITION:  PACU - hemodynamically stable.   Delay start of Pharmacological VTE agent (>24hrs) due to surgical blood loss or risk of bleeding: not applicable

## 2012-09-28 NOTE — Anesthesia Preprocedure Evaluation (Signed)
Anesthesia Evaluation  Patient identified by MRN, date of birth, ID band Patient awake    Reviewed: Allergy & Precautions, H&P , NPO status , Patient's Chart, lab work & pertinent test results  Airway Mallampati: I TM Distance: >3 FB Neck ROM: Full    Dental no notable dental hx.    Pulmonary Current Smoker,    Pulmonary exam normal       Cardiovascular negative cardio ROS  Rhythm:Regular Rate:Normal     Neuro/Psych  Headaches,  Neuromuscular disease    GI/Hepatic negative GI ROS, Neg liver ROS,   Endo/Other  negative endocrine ROS  Renal/GU negative Renal ROS     Musculoskeletal negative musculoskeletal ROS (+)   Abdominal Normal abdominal exam  (+)   Peds  Hematology negative hematology ROS (+)   Anesthesia Other Findings   Reproductive/Obstetrics                           Anesthesia Physical Anesthesia Plan  ASA: II  Anesthesia Plan: Bier Block   Post-op Pain Management:    Induction: Intravenous  Airway Management Planned: Nasal Cannula  Additional Equipment:   Intra-op Plan:   Post-operative Plan:   Informed Consent: I have reviewed the patients History and Physical, chart, labs and discussed the procedure including the risks, benefits and alternatives for the proposed anesthesia with the patient or authorized representative who has indicated his/her understanding and acceptance.     Plan Discussed with: CRNA  Anesthesia Plan Comments:         Anesthesia Quick Evaluation

## 2012-09-28 NOTE — Interval H&P Note (Signed)
History and Physical Interval Note:  09/28/2012 10:46 AM  Valerie Jarvis  has presented today for surgery, with the diagnosis of trigger finger right long  The various methods of treatment have been discussed with the patient and family. After consideration of risks, benefits and other options for treatment, the patient has consented to  Procedure(s) with comments: RELEASE TRIGGER FINGER/A-1 PULLEY (Right) - Right Long Finger as a surgical intervention .  The patient's history has been reviewed, patient examined, no change in status, stable for surgery.  I have reviewed the patient's chart and labs.  Questions were answered to the patient's satisfaction.     Fuller Canada

## 2012-09-28 NOTE — Addendum Note (Signed)
Addendum created 09/28/12 1431 by Roselie Awkward, MD   Modules edited: Anesthesia Attestations

## 2012-09-28 NOTE — Progress Notes (Signed)
Awake. Denies pain. Wants to go home.

## 2012-10-01 ENCOUNTER — Encounter: Payer: Self-pay | Admitting: Orthopedic Surgery

## 2012-10-01 ENCOUNTER — Ambulatory Visit (INDEPENDENT_AMBULATORY_CARE_PROVIDER_SITE_OTHER): Payer: BC Managed Care – PPO | Admitting: Orthopedic Surgery

## 2012-10-01 ENCOUNTER — Encounter (HOSPITAL_COMMUNITY): Payer: Self-pay | Admitting: Orthopedic Surgery

## 2012-10-01 VITALS — Ht 61.0 in | Wt 152.0 lb

## 2012-10-01 DIAGNOSIS — M6530 Trigger finger, unspecified finger: Secondary | ICD-10-CM

## 2012-10-01 NOTE — Progress Notes (Signed)
Patient ID: Valerie Jarvis, female   DOB: [birth year], 37 y.o.   MRN: [id number] Status post release right long finger for triggering on May 16  His postop day 3  Patient going well except for soreness  I reviewed range of motion exercise program with her including active and inactive assisted range of motion exercises which she is to begin immediately and followup with me for suture removal in about 10 days

## 2012-10-01 NOTE — Patient Instructions (Addendum)
Keep clean keep dry   Start exercises

## 2012-10-04 ENCOUNTER — Ambulatory Visit (INDEPENDENT_AMBULATORY_CARE_PROVIDER_SITE_OTHER): Payer: BC Managed Care – PPO | Admitting: Orthopedic Surgery

## 2012-10-04 ENCOUNTER — Other Ambulatory Visit: Payer: Self-pay | Admitting: *Deleted

## 2012-10-04 VITALS — BP 102/72 | Ht 61.0 in | Wt 152.0 lb

## 2012-10-04 DIAGNOSIS — S62309A Unspecified fracture of unspecified metacarpal bone, initial encounter for closed fracture: Secondary | ICD-10-CM

## 2012-10-04 DIAGNOSIS — M6530 Trigger finger, unspecified finger: Secondary | ICD-10-CM

## 2012-10-04 MED ORDER — HYDROCODONE-ACETAMINOPHEN 5-325 MG PO TABS: 1.0000 | ORAL_TABLET | ORAL | Status: DC | PRN

## 2012-10-04 NOTE — Patient Instructions (Addendum)
Keep covered to keep clean

## 2012-10-04 NOTE — Progress Notes (Signed)
Patient ID: Valerie Jarvis, female   DOB: 03-13-1976, 37 y.o.   MRN: [id number] Chief Complaint  Patient presents with  . Wound Check    Suture busted out    The patient had some bleeding and all see any sutures loose all the knots are intact the suture line is intact there is no sign of infection she's making a full fist now  Keep appointment for suture removal

## 2012-10-11 ENCOUNTER — Ambulatory Visit (INDEPENDENT_AMBULATORY_CARE_PROVIDER_SITE_OTHER): Payer: BC Managed Care – PPO | Admitting: Orthopedic Surgery

## 2012-10-11 VITALS — BP 102/60 | Ht 61.0 in | Wt 152.0 lb

## 2012-10-11 DIAGNOSIS — M6530 Trigger finger, unspecified finger: Secondary | ICD-10-CM

## 2012-10-11 DIAGNOSIS — T814XXA Infection following a procedure, initial encounter: Secondary | ICD-10-CM

## 2012-10-11 DIAGNOSIS — T8140XA Infection following a procedure, unspecified, initial encounter: Secondary | ICD-10-CM

## 2012-10-11 MED ORDER — SULFAMETHOXAZOLE-TRIMETHOPRIM 800-160 MG PO TABS: 1.0000 | ORAL_TABLET | Freq: Two times a day (BID) | ORAL | Status: DC

## 2012-10-11 NOTE — Progress Notes (Signed)
Patient ID: Valerie Jarvis, female   DOB: Aug 09, 1975, 37 y.o.   MRN: [id number] Chief Complaint  Patient presents with  . Follow-up    Recheck with suture removal of right long finger. DOS 09-28-12.    The patient came in to get her sutures removed she thought she popped a suture last week but she didn't she actually had some fluid expressed. There is a knot at the proximal suture line next not purulent I did put her on antibiotics just as a prophylactic measure to come back in a week for wound check after taking Bactrim 1 DS twice a day

## 2012-10-18 ENCOUNTER — Ambulatory Visit (INDEPENDENT_AMBULATORY_CARE_PROVIDER_SITE_OTHER): Payer: BC Managed Care – PPO | Admitting: Orthopedic Surgery

## 2012-10-18 DIAGNOSIS — M6530 Trigger finger, unspecified finger: Secondary | ICD-10-CM

## 2012-10-18 DIAGNOSIS — Z5189 Encounter for other specified aftercare: Secondary | ICD-10-CM

## 2012-10-18 DIAGNOSIS — T814XXD Infection following a procedure, subsequent encounter: Secondary | ICD-10-CM

## 2012-10-18 NOTE — Progress Notes (Signed)
Patient ID: Valerie Jarvis, female   DOB: 1975/08/31, 37 y.o.   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

## 2012-11-12 ENCOUNTER — Ambulatory Visit: Payer: BC Managed Care – PPO | Admitting: Orthopedic Surgery

## 2012-11-15 ENCOUNTER — Ambulatory Visit (INDEPENDENT_AMBULATORY_CARE_PROVIDER_SITE_OTHER): Payer: BC Managed Care – PPO | Admitting: Orthopedic Surgery

## 2012-11-15 VITALS — BP 110/72 | Ht 61.0 in | Wt 152.0 lb

## 2012-11-15 DIAGNOSIS — T8140XA Infection following a procedure, unspecified, initial encounter: Secondary | ICD-10-CM

## 2012-11-15 DIAGNOSIS — T814XXA Infection following a procedure, initial encounter: Secondary | ICD-10-CM

## 2012-11-15 MED ORDER — SULFAMETHOXAZOLE-TRIMETHOPRIM 800-160 MG PO TABS: 1.0000 | ORAL_TABLET | Freq: Two times a day (BID) | ORAL | Status: DC

## 2012-11-15 NOTE — Patient Instructions (Addendum)
Neosporin daily and wear gloves when working

## 2012-11-15 NOTE — Progress Notes (Signed)
Patient ID: Valerie Jarvis, female   DOB: 16-May-1976, 37 y.o.   MRN: [id number] Chief Complaint  Patient presents with  . Follow-up    Right trigger finger wound check DOS 09/28/12     The patient has a pinpoint opening at the base of the incision with drainage which is clear. 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

## 2012-11-22 ENCOUNTER — Other Ambulatory Visit: Payer: Self-pay | Admitting: *Deleted

## 2012-11-22 ENCOUNTER — Encounter: Payer: Self-pay | Admitting: Orthopedic Surgery

## 2012-11-22 ENCOUNTER — Ambulatory Visit (INDEPENDENT_AMBULATORY_CARE_PROVIDER_SITE_OTHER): Payer: BC Managed Care – PPO | Admitting: Orthopedic Surgery

## 2012-11-22 VITALS — BP 104/44 | Ht 61.0 in | Wt 152.0 lb

## 2012-11-22 DIAGNOSIS — Z5189 Encounter for other specified aftercare: Secondary | ICD-10-CM

## 2012-11-22 DIAGNOSIS — M6530 Trigger finger, unspecified finger: Secondary | ICD-10-CM

## 2012-11-22 DIAGNOSIS — T814XXD Infection following a procedure, subsequent encounter: Secondary | ICD-10-CM

## 2012-11-22 NOTE — Progress Notes (Signed)
Patient ID: Valerie Jarvis, female   DOB: 09/18/1975, 36 y.o.   MRN: [id number] Chief Complaint  Patient presents with  . Follow-up    1 week Right long  trigger finger wound check DOS 09/28/12   The patient developed a wound infection treated with antibiotics did not improve drainage increasing pain increasing proximally and distally  I recommend that she have surgical drainage right hand tendon sheath  Today's exam shows tenderness from the PIP joint to the proximal aspect of the carpal tunnel there is no redness but significant clear drainage. She can still flex and extend the hand and the finger. She is not showing any evidence of lymphangitis  History and physical incorporated by reference  Plan surgical drainage of the right long finger tendon sheath  

## 2012-11-22 NOTE — Patient Instructions (Addendum)
Surgery right hand I/D this Monday (outpatient) [phone]

## 2012-11-23 ENCOUNTER — Encounter (HOSPITAL_COMMUNITY)
Admission: RE | Admit: 2012-11-23 | Discharge: 2012-11-23 | Disposition: A | Discharge status: Home / Self Care | Payer: BC Managed Care – PPO | Source: Ambulatory Visit | Attending: Orthopedic Surgery | Admitting: Orthopedic Surgery

## 2012-11-23 ENCOUNTER — Encounter (HOSPITAL_COMMUNITY): Payer: Self-pay | Admitting: Pharmacy Technician

## 2012-11-23 ENCOUNTER — Encounter (HOSPITAL_COMMUNITY): Payer: Self-pay

## 2012-11-23 ENCOUNTER — Telehealth: Payer: Self-pay | Admitting: Orthopedic Surgery

## 2012-11-23 MED ORDER — CHLORHEXIDINE GLUCONATE 4 % EX LIQD: 60.0000 mL | Freq: Once | CUTANEOUS | Status: DC

## 2012-11-23 NOTE — Patient Instructions (Addendum)
Valerie Jarvis  11/23/2012   Your procedure is scheduled on:  11/26/12  Report to [facility] at 08:40 AM.  Call this number if you have problems the morning of surgery: [phone]   Remember:   Do not eat food or drink liquids after midnight.   Take these medicines the morning of surgery with A SIP OF WATER: None  Do not wear jewelry, make-up or nail polish.  Do not wear lotions, powders, or perfumes.   Do not shave 48 hours prior to surgery. Men may shave face and neck.  Do not bring valuables to the hospital.  [facility] is not responsible for any belongings or valuables.  Contacts, dentures or bridgework may not be worn into surgery.  Leave suitcase in the car. After surgery it may be brought to your room.  For patients admitted to the hospital, checkout time is 11:00 AM the day of discharge.   Patients discharged the day of surgery will not be allowed to drive home.    Special Instructions: Shower using CHG 2 nights before surgery and the night before surgery.  If you shower the day of surgery use CHG.  Use special wash - you have one bottle of CHG for all showers.  You should use approximately 1/3 of the bottle for each shower.   Please read over the following fact sheets that you were given: Pain Booklet, Surgical Site Infection Prevention, Anesthesia Post-op Instructions and Care and Recovery After Surgery    Incision and Drainage Incision and drainage is a procedure in which a sac-like structure (cystic structure) is opened and drained. The area to be drained usually contains material such as pus, fluid, or blood.  LET YOUR CAREGIVER KNOW ABOUT:   Allergies to medicine.  Medicines taken, including vitamins, herbs, eyedrops, over-the-counter medicines, and creams.  Use of steroids (by mouth or creams).  Previous problems with anesthetics or numbing medicines.  History of bleeding problems or blood clots.  Previous surgery.  Other health problems, including diabetes  and kidney problems.  Possibility of pregnancy, if this applies. RISKS AND COMPLICATIONS  Pain.  Bleeding.  Scarring.  Infection. BEFORE THE PROCEDURE  You may need to have an ultrasound or other imaging tests to see how large or deep your cystic structure is. Blood tests may also be used to determine if you have an infection or how severe the infection is. You may need to have a tetanus shot. PROCEDURE  The affected area is cleaned with a cleaning fluid. The cyst area will then be numbed with a medicine (local anesthetic). A small incision will be made in the cystic structure. A syringe or catheter may be used to drain the contents of the cystic structure, or the contents may be squeezed out. The area will then be flushed with a cleansing solution. After cleansing the area, it is often gently packed with a gauze or another wound dressing. Once it is packed, it will be covered with gauze and tape or some other type of wound dressing. AFTER THE PROCEDURE   Often, you will be allowed to go home right after the procedure.  You may be given antibiotic medicine to prevent or heal an infection.  If the area was packed with gauze or some other wound dressing, you will likely need to come back in 1 to 2 days to get it removed.  The area should heal in about 14 days. Document Released: 10/26/2000 Document Revised: 11/01/2011 Document Reviewed: 06/27/2011 Exit[employer] Patient Information  2014 [place], [place].    PATIENT INSTRUCTIONS POST-ANESTHESIA  IMMEDIATELY FOLLOWING SURGERY:  Do not drive or operate machinery for the first twenty four hours after surgery.  Do not make any important decisions for twenty four hours after surgery or while taking narcotic pain medications or sedatives.  If you develop intractable nausea and vomiting or a severe headache please notify your doctor immediately.  FOLLOW-UP:  Please make an appointment with your surgeon as instructed. You do not need to follow up  with anesthesia unless specifically instructed to do so.  WOUND CARE INSTRUCTIONS (if applicable):  Keep a dry clean dressing on the anesthesia/puncture wound site if there is drainage.  Once the wound has quit draining you may leave it open to air.  Generally you should leave the bandage intact for twenty four hours unless there is drainage.  If the epidural site drains for more than 36-48 hours please call the anesthesia department.  QUESTIONS?:  Please feel free to call your physician or the hospital operator if you have any questions, and they will be happy to assist you.

## 2012-11-23 NOTE — Telephone Encounter (Signed)
Contacted insurer, [employer], ph# [phone], regarding out-patient surgery scheduled 11/26/12 at [facility], CPT [phone], possible additional [phone], ICD-9 [phone].  Per Marchelle L, no pre-authorization is required; her name and today's date for reference, 11/23/12, 9:49a.m.

## 2012-11-26 ENCOUNTER — Ambulatory Visit (HOSPITAL_COMMUNITY)
Admission: RE | Admit: 2012-11-26 | Discharge: 2012-11-26 | Disposition: A | Discharge status: Home / Self Care | Payer: BC Managed Care – PPO | Source: Ambulatory Visit | Attending: Orthopedic Surgery | Admitting: Orthopedic Surgery

## 2012-11-26 ENCOUNTER — Ambulatory Visit (HOSPITAL_COMMUNITY): Payer: BC Managed Care – PPO | Admitting: Anesthesiology

## 2012-11-26 ENCOUNTER — Encounter (HOSPITAL_COMMUNITY)
Admission: RE | Disposition: A | Discharge status: Home / Self Care | Payer: Self-pay | Source: Ambulatory Visit | Attending: Orthopedic Surgery

## 2012-11-26 ENCOUNTER — Encounter (HOSPITAL_COMMUNITY): Payer: Self-pay | Admitting: Anesthesiology

## 2012-11-26 ENCOUNTER — Encounter (HOSPITAL_COMMUNITY): Payer: Self-pay | Admitting: *Deleted

## 2012-11-26 DIAGNOSIS — Y838 Other surgical procedures as the cause of abnormal reaction of the patient, or of later complication, without mention of misadventure at the time of the procedure: Secondary | ICD-10-CM | POA: Insufficient documentation

## 2012-11-26 DIAGNOSIS — T814XXA Infection following a procedure, initial encounter: Secondary | ICD-10-CM

## 2012-11-26 DIAGNOSIS — T8140XA Infection following a procedure, unspecified, initial encounter: Secondary | ICD-10-CM

## 2012-11-26 DIAGNOSIS — Z01812 Encounter for preprocedural laboratory examination: Secondary | ICD-10-CM | POA: Insufficient documentation

## 2012-11-26 DIAGNOSIS — M6530 Trigger finger, unspecified finger: Secondary | ICD-10-CM

## 2012-11-26 HISTORY — PX: INCISION AND DRAINAGE: SHX5863

## 2012-11-26 SURGERY — INCISION AND DRAINAGE
Anesthesia: General | Site: Finger | Laterality: Right | Wound class: Dirty or Infected

## 2012-11-26 MED ORDER — SCOPOLAMINE 1 MG/3DAYS TD PT72
MEDICATED_PATCH | TRANSDERMAL | Status: AC
  Filled 2012-11-26: qty 1

## 2012-11-26 MED ORDER — CEFAZOLIN SODIUM-DEXTROSE 2-3 GM-% IV SOLR
2.0000 g | INTRAVENOUS | Status: AC
  Administered 2012-11-26: 2 g via INTRAVENOUS

## 2012-11-26 MED ORDER — PROMETHAZINE HCL 12.5 MG PO TABS: 12.5000 mg | ORAL_TABLET | Freq: Four times a day (QID) | ORAL | Status: DC | PRN

## 2012-11-26 MED ORDER — MIDAZOLAM HCL 2 MG/2ML IJ SOLN
INTRAMUSCULAR | Status: AC
  Filled 2012-11-26: qty 2

## 2012-11-26 MED ORDER — FENTANYL CITRATE 0.05 MG/ML IJ SOLN
25.0000 ug | INTRAMUSCULAR | Status: DC | PRN
  Administered 2012-11-26 (×2): 25 ug via INTRAVENOUS

## 2012-11-26 MED ORDER — DEXAMETHASONE SODIUM PHOSPHATE 4 MG/ML IJ SOLN
4.0000 mg | Freq: Once | INTRAMUSCULAR | Status: AC
  Administered 2012-11-26: 4 mg via INTRAVENOUS

## 2012-11-26 MED ORDER — PROPOFOL 10 MG/ML IV BOLUS
INTRAVENOUS | Status: DC | PRN
  Administered 2012-11-26: 200 mg via INTRAVENOUS

## 2012-11-26 MED ORDER — MIDAZOLAM HCL 5 MG/5ML IJ SOLN
INTRAMUSCULAR | Status: DC | PRN
  Administered 2012-11-26: 2 mg via INTRAVENOUS

## 2012-11-26 MED ORDER — MIDAZOLAM HCL 2 MG/2ML IJ SOLN
1.0000 mg | INTRAMUSCULAR | Status: DC | PRN
  Administered 2012-11-26: 2 mg via INTRAVENOUS

## 2012-11-26 MED ORDER — LIDOCAINE HCL 1 % IJ SOLN
INTRAMUSCULAR | Status: DC | PRN
  Administered 2012-11-26: 50 mg via INTRADERMAL

## 2012-11-26 MED ORDER — FENTANYL CITRATE 0.05 MG/ML IJ SOLN
25.0000 ug | INTRAMUSCULAR | Status: DC | PRN
  Administered 2012-11-26 (×2): 50 ug via INTRAVENOUS

## 2012-11-26 MED ORDER — FENTANYL CITRATE 0.05 MG/ML IJ SOLN
INTRAMUSCULAR | Status: DC | PRN
  Administered 2012-11-26 (×4): 50 ug via INTRAVENOUS

## 2012-11-26 MED ORDER — FENTANYL CITRATE 0.05 MG/ML IJ SOLN
INTRAMUSCULAR | Status: AC
  Filled 2012-11-26: qty 2

## 2012-11-26 MED ORDER — SODIUM CHLORIDE 0.9 % IR SOLN
Status: DC | PRN
  Administered 2012-11-26: 1000 mL

## 2012-11-26 MED ORDER — LIDOCAINE HCL (PF) 1 % IJ SOLN
INTRAMUSCULAR | Status: AC
  Filled 2012-11-26: qty 5

## 2012-11-26 MED ORDER — ONDANSETRON HCL 4 MG/2ML IJ SOLN
INTRAMUSCULAR | Status: AC
  Filled 2012-11-26: qty 2

## 2012-11-26 MED ORDER — SULFAMETHOXAZOLE-TRIMETHOPRIM 800-160 MG PO TABS: 1.0000 | ORAL_TABLET | Freq: Two times a day (BID) | ORAL | Status: DC

## 2012-11-26 MED ORDER — DEXAMETHASONE SODIUM PHOSPHATE 4 MG/ML IJ SOLN
INTRAMUSCULAR | Status: AC
  Filled 2012-11-26: qty 1

## 2012-11-26 MED ORDER — ONDANSETRON HCL 4 MG/2ML IJ SOLN
4.0000 mg | Freq: Once | INTRAMUSCULAR | Status: AC
  Administered 2012-11-26: 4 mg via INTRAVENOUS

## 2012-11-26 MED ORDER — HYDROCODONE-ACETAMINOPHEN 10-325 MG PO TABS: 1.0000 | ORAL_TABLET | ORAL | Status: DC | PRN

## 2012-11-26 MED ORDER — PROPOFOL 10 MG/ML IV EMUL
INTRAVENOUS | Status: AC
  Filled 2012-11-26: qty 20

## 2012-11-26 MED ORDER — CEFAZOLIN SODIUM-DEXTROSE 2-3 GM-% IV SOLR
INTRAVENOUS | Status: AC
  Filled 2012-11-26: qty 50

## 2012-11-26 MED ORDER — SCOPOLAMINE 1 MG/3DAYS TD PT72
1.0000 | MEDICATED_PATCH | Freq: Once | TRANSDERMAL | Status: DC
  Administered 2012-11-26: 1.5 mg via TRANSDERMAL

## 2012-11-26 MED ORDER — LACTATED RINGERS IV SOLN
INTRAVENOUS | Status: DC
  Administered 2012-11-26: 1000 mL via INTRAVENOUS

## 2012-11-26 MED ORDER — ONDANSETRON HCL 4 MG/2ML IJ SOLN: 4.0000 mg | Freq: Once | INTRAMUSCULAR | Status: DC | PRN

## 2012-11-26 SURGICAL SUPPLY — 41 items
BAG HAMPER (MISCELLANEOUS) ×2 IMPLANT
BANDAGE CONFORM 2  STR LF (GAUZE/BANDAGES/DRESSINGS) IMPLANT
BANDAGE ELASTIC 3 VELCRO NS (GAUZE/BANDAGES/DRESSINGS) ×2 IMPLANT
BANDAGE ELASTIC 6 VELCRO ST LF (GAUZE/BANDAGES/DRESSINGS) IMPLANT
BANDAGE ESMARK 6X9 LF (GAUZE/BANDAGES/DRESSINGS) ×1 IMPLANT
BANDAGE GAUZE ELAST BULKY 4 IN (GAUZE/BANDAGES/DRESSINGS) ×2 IMPLANT
BLADE SURG 15 STRL LF DISP TIS (BLADE) ×1 IMPLANT
BLADE SURG 15 STRL SS (BLADE) ×1
BNDG ELASTIC 2 VLCR STRL LF (GAUZE/BANDAGES/DRESSINGS) IMPLANT
BNDG ESMARK 6X9 LF (GAUZE/BANDAGES/DRESSINGS) ×2
CANNULA VESSEL W/WING WO/VALVE (CANNULA) ×2 IMPLANT
CLOTH BEACON ORANGE TIMEOUT ST (SAFETY) ×2 IMPLANT
COVER LIGHT HANDLE STERIS (MISCELLANEOUS) ×4 IMPLANT
CUFF TOURNIQUET SINGLE 18IN (TOURNIQUET CUFF) ×2 IMPLANT
DRSG ALLEVYN 2X2 (GAUZE/BANDAGES/DRESSINGS) IMPLANT
DRSG XEROFORM 1X8 (GAUZE/BANDAGES/DRESSINGS) ×2 IMPLANT
ELECT REM PT RETURN 9FT ADLT (ELECTROSURGICAL) ×2
ELECTRODE REM PT RTRN 9FT ADLT (ELECTROSURGICAL) ×1 IMPLANT
GLOVE BIOGEL PI IND STRL 7.0 (GLOVE) ×1 IMPLANT
GLOVE BIOGEL PI INDICATOR 7.0 (GLOVE) ×1
GLOVE EXAM NITRILE MD LF STRL (GLOVE) ×4 IMPLANT
GLOVE SKINSENSE NS SZ8.0 LF (GLOVE) ×1
GLOVE SKINSENSE STRL SZ8.0 LF (GLOVE) ×1 IMPLANT
GLOVE SS BIOGEL STRL SZ 6.5 (GLOVE) ×1 IMPLANT
GLOVE SS N UNI LF 8.5 STRL (GLOVE) ×2 IMPLANT
GLOVE SUPERSENSE BIOGEL SZ 6.5 (GLOVE) ×1
GOWN STRL REIN XL XLG (GOWN DISPOSABLE) ×4 IMPLANT
HAND ALUMI LG (SOFTGOODS) ×2 IMPLANT
KIT ROOM TURNOVER APOR (KITS) ×2 IMPLANT
MANIFOLD NEPTUNE II (INSTRUMENTS) ×2 IMPLANT
NS IRRIG 1000ML POUR BTL (IV SOLUTION) ×2 IMPLANT
PACK BASIC LIMB (CUSTOM PROCEDURE TRAY) IMPLANT
PAD ABD 5X9 TENDERSORB (GAUZE/BANDAGES/DRESSINGS) IMPLANT
PAD ARMBOARD 7.5X6 YLW CONV (MISCELLANEOUS) ×2 IMPLANT
PADDING CAST COTTON 6X4 STRL (CAST SUPPLIES) IMPLANT
SET BASIN LINEN APH (SET/KITS/TRAYS/PACK) ×2 IMPLANT
SPONGE GAUZE 4X4 12PLY (GAUZE/BANDAGES/DRESSINGS) ×4 IMPLANT
SUT ETHILON 3 0 FSL (SUTURE) ×2 IMPLANT
SWAB CULTURE LIQ STUART DBL (MISCELLANEOUS) IMPLANT
SYR BULB IRRIGATION 50ML (SYRINGE) ×2 IMPLANT
VESSEL LOOPS MAXI RED (MISCELLANEOUS) ×2 IMPLANT

## 2012-11-26 NOTE — Op Note (Signed)
11/26/2012  11:36 AM  PATIENT:  Valerie Jarvis  37 y.o. female  PRE-OPERATIVE DIAGNOSIS:  post op infection right long finger  POST-OPERATIVE DIAGNOSIS:  flexior tenosynovitis right long finger   PROCEDURE:  Procedure(s) with comments: INCISION AND DRAINAGE RIGHT LONG FINGER (Right) - Right Hand Incision and Drainage  FINDINGS: There is no purulent drainage but there was clear synovial fluid tracking to the incision. When we opened up the carpal tunnel and the distal flexor tendon sheath no purulent material is found  Procedural details The patient was identified in the preop area as Lanier Prude right long finger was confirmed as a surgical site the patient was taken back to the operating room for general anesthesia. After successful anesthesia the arm was prepped and draped with Betadine. Timeout was completed.  The previous longitudinal incision was opened up and then an oblique incision was made over the middle phalanx as well as over the carpal tunnel. Clear fluid was noted in the wound and express proximally and distally by pressure towards the wound. There was no purulent material. The flexor tendons were intact. A red rubber catheter was passed under the flexor sheath from the Palm into the distal part of the wound over the middle phalanx and 200 cc of fluid was passed beneath the area. Then we repeated this into the carpal total after it was opened.  A red rubber catheter was placed as a drain to keep the wound open.  We then followed this by suture closure of the wounds with 3-0 nylon in interrupted fashion.  SURGEON:  Surgeon(s) and Role:    * Vickki Hearing, MD - Primary  PHYSICIAN ASSISTANT:   ASSISTANTS: none   ANESTHESIA:   general  EBL:  Total I/O In: 500 [I.V.:500] Out: -   BLOOD ADMINISTERED:none  DRAINS: vessel loop   LOCAL MEDICATIONS USED:  NONE  SPECIMEN:  No Specimen  DISPOSITION OF SPECIMEN:  N/A  COUNTS:  YES  TOURNIQUET:   Total  Tourniquet Time Documented: Upper Arm (Right) - 35 minutes Total: Upper Arm (Right) - 35 minutes   DICTATION: .Reubin Milan Dictation  PLAN OF CARE: Discharge to home after PACU  PATIENT DISPOSITION:  PACU - hemodynamically stable.   Delay start of Pharmacological VTE agent (>24hrs) due to surgical blood loss or risk of bleeding: not applicable

## 2012-11-26 NOTE — Brief Op Note (Signed)
11/26/2012  11:36 AM  PATIENT:  Valerie Jarvis  36 y.o. female  PRE-OPERATIVE DIAGNOSIS:  post op infection right long finger  POST-OPERATIVE DIAGNOSIS:  flexior tenosynovitis right long finger   PROCEDURE:  Procedure(s) with comments: INCISION AND DRAINAGE RIGHT LONG FINGER (Right) - Right Hand Incision and Drainage  FINDINGS: There is no purulent drainage but there was clear synovial fluid tracking to the incision. When we opened up the carpal tunnel and the distal flexor tendon sheath no purulent material is found  Procedural details The patient was identified in the preop area as Valerie Jarvis right long finger was confirmed as a surgical site the patient was taken back to the operating room for general anesthesia. After successful anesthesia the arm was prepped and draped with Betadine. Timeout was completed.  The previous longitudinal incision was opened up and then an oblique incision was made over the middle phalanx as well as over the carpal tunnel. Clear fluid was noted in the wound and express proximally and distally by pressure towards the wound. There was no purulent material. The flexor tendons were intact. A red rubber catheter was passed under the flexor sheath from the Palm into the distal part of the wound over the middle phalanx and 200 cc of fluid was passed beneath the area. Then we repeated this into the carpal total after it was opened.  A red rubber catheter was placed as a drain to keep the wound open.  We then followed this by suture closure of the wounds with 3-0 nylon in interrupted fashion.  SURGEON:  Surgeon(s) and Role:    * Stanley E Harrison, MD - Primary  PHYSICIAN ASSISTANT:   ASSISTANTS: none   ANESTHESIA:   general  EBL:  Total I/O In: 500 [I.V.:500] Out: -   BLOOD ADMINISTERED:none  DRAINS: vessel loop   LOCAL MEDICATIONS USED:  NONE  SPECIMEN:  No Specimen  DISPOSITION OF SPECIMEN:  N/A  COUNTS:  YES  TOURNIQUET:   Total  Tourniquet Time Documented: Upper Arm (Right) - 35 minutes Total: Upper Arm (Right) - 35 minutes   DICTATION: .Dragon Dictation  PLAN OF CARE: Discharge to home after PACU  PATIENT DISPOSITION:  PACU - hemodynamically stable.   Delay start of Pharmacological VTE agent (>24hrs) due to surgical blood loss or risk of bleeding: not applicable 

## 2012-11-26 NOTE — Interval H&P Note (Signed)
History and Physical Interval Note:  11/26/2012 10:11 AM  Valerie Jarvis  has presented today for surgery, with the diagnosis of post op infection right long finger  The various methods of treatment have been discussed with the patient and family. After consideration of risks, benefits and other options for treatment, the patient has consented to  Procedure(s): INCISION AND DRAINAGE RIGHT LONG FINGER (Right) as a surgical intervention .  The patient's history has been reviewed, patient examined, no change in status, stable for surgery.  I have reviewed the patient's chart and labs.  Questions were answered to the patient's satisfaction.     Fuller Canada

## 2012-11-26 NOTE — Interval H&P Note (Signed)
History and Physical Interval Note:  11/26/2012 10:06 AM  Valerie Jarvis  has presented today for surgery, with the diagnosis of post op infection right long finger  The various methods of treatment have been discussed with the patient and family. After consideration of risks, benefits and other options for treatment, the patient has consented to  Procedure(s): INCISION AND DRAINAGE RIGHT LONG FINGER (Right) as a surgical intervention .  The patient's history has been reviewed, patient examined, no change in status, stable for surgery.  I have reviewed the patient's chart and labs.  Questions were answered to the patient's satisfaction.     Fuller Canada

## 2012-11-26 NOTE — Transfer of Care (Signed)
Immediate Anesthesia Transfer of Care Note  Patient: Valerie Jarvis  Procedure(s) Performed: Procedure(s) with comments: INCISION AND DRAINAGE RIGHT LONG FINGER (Right) - Right Hand Incision and Drainage  Patient Location: PACU  Anesthesia Type:General  Level of Consciousness: awake and patient cooperative  Airway & Oxygen Therapy: Patient Spontanous Breathing and Patient connected to face mask oxygen  Post-op Assessment: Report given to PACU RN, Post -op Vital signs reviewed and stable and Patient moving all extremities  Post vital signs: Reviewed and stable  Complications: No apparent anesthesia complications

## 2012-11-26 NOTE — Anesthesia Preprocedure Evaluation (Signed)
Anesthesia Evaluation  Patient identified by MRN, date of birth, ID band Patient awake    Reviewed: Allergy & Precautions, H&P , NPO status , Patient's Chart, lab work & pertinent test results  Airway Mallampati: I TM Distance: >3 FB Neck ROM: Full    Dental no notable dental hx.    Pulmonary Current Smoker,    Pulmonary exam normal       Cardiovascular negative cardio ROS  Rhythm:Regular Rate:Normal     Neuro/Psych  Headaches,  Neuromuscular disease    GI/Hepatic negative GI ROS, Neg liver ROS,   Endo/Other  negative endocrine ROS  Renal/GU negative Renal ROS     Musculoskeletal negative musculoskeletal ROS (+)   Abdominal Normal abdominal exam  (+)   Peds  Hematology negative hematology ROS (+)   Anesthesia Other Findings   Reproductive/Obstetrics                           Anesthesia Physical Anesthesia Plan  ASA: II  Anesthesia Plan: General   Post-op Pain Management:    Induction: Intravenous  Airway Management Planned: LMA  Additional Equipment:   Intra-op Plan:   Post-operative Plan: Extubation in OR  Informed Consent: I have reviewed the patients History and Physical, chart, labs and discussed the procedure including the risks, benefits and alternatives for the proposed anesthesia with the patient or authorized representative who has indicated his/her understanding and acceptance.     Plan Discussed with:   Anesthesia Plan Comments:         Anesthesia Quick Evaluation

## 2012-11-26 NOTE — Anesthesia Postprocedure Evaluation (Signed)
  Anesthesia Post-op Note  Patient: Valerie Jarvis  Procedure(s) Performed: Procedure(s) with comments: INCISION AND DRAINAGE RIGHT LONG FINGER (Right) - Right Hand Incision and Drainage  Patient Location: PACU  Anesthesia Type:General  Level of Consciousness: awake, alert , oriented and patient cooperative  Airway and Oxygen Therapy: Patient Spontanous Breathing  Post-op Pain: 3 /10, mild  Post-op Assessment: Post-op Vital signs reviewed, Patient's Cardiovascular Status Stable, Respiratory Function Stable, Patent Airway, No signs of Nausea or vomiting and Pain level controlled  Post-op Vital Signs: Reviewed and stable  Complications: No apparent anesthesia complications

## 2012-11-26 NOTE — H&P (View-Only) (Signed)
Patient ID: Valerie Jarvis, female   DOB: 02/17/1976, 36 y.o.   MRN: [id number] Chief Complaint  Patient presents with  . Follow-up    Right trigger finger wound check DOS 09/28/12     The patient has a pinpoint opening at the base of the incision with drainage which is clear. 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 

## 2012-11-26 NOTE — H&P (View-Only) (Signed)
Patient ID: Valerie Jarvis, female   DOB: 11/02/1975, 37 y.o.   MRN: [id number] Chief Complaint  Patient presents with  . Follow-up    1 week Right long  trigger finger wound check DOS 09/28/12   The patient developed a wound infection treated with antibiotics did not improve drainage increasing pain increasing proximally and distally  I recommend that she have surgical drainage right hand tendon sheath  Today's exam shows tenderness from the PIP joint to the proximal aspect of the carpal tunnel there is no redness but significant clear drainage. She can still flex and extend the hand and the finger. She is not showing any evidence of lymphangitis  History and physical incorporated by reference  Plan surgical drainage of the right long finger tendon sheath

## 2012-11-26 NOTE — Anesthesia Procedure Notes (Signed)
Procedure Name: LMA Insertion Date/Time: 11/26/2012 10:42 AM Performed by: Despina Hidden Pre-anesthesia Checklist: Emergency Drugs available, Patient identified, Suction available and Patient being monitored Patient Re-evaluated:Patient Re-evaluated prior to inductionOxygen Delivery Method: Circle system utilized Preoxygenation: Pre-oxygenation with 100% oxygen Intubation Type: IV induction Ventilation: Mask ventilation without difficulty LMA Size: 3.0 Number of attempts: 1 Placement Confirmation: positive ETCO2 and breath sounds checked- equal and bilateral Tube secured with: Tape Dental Injury: Teeth and Oropharynx as per pre-operative assessment

## 2012-11-26 NOTE — Interval H&P Note (Signed)
History and Physical Interval Note:  11/26/2012 10:05 AM  Valerie Jarvis  has presented today for surgery, with the diagnosis of post op infection right long finger  The various methods of treatment have been discussed with the patient and family. After consideration of risks, benefits and other options for treatment, the patient has consented to  Procedure(s): INCISION AND DRAINAGE RIGHT LONG FINGER (Right) as a surgical intervention .  The patient's history has been reviewed, patient examined, no change in status, stable for surgery.  I have reviewed the patient's chart and labs.  Questions were answered to the patient's satisfaction.     Fuller Canada

## 2012-11-26 NOTE — H&P (Signed)
Valerie Jarvis is an 37 y.o. female.   Chief Complaint: wound drainage right hand  HPI: s/p triiger finger release rlf. Post op wound opened, treated with po antibiotics, pain increased drainage increased, pain extended proximal and distal. She was not getting better so I recommended I/D  Past Medical History  Diagnosis Date  . [id number](540.9)     Past Surgical History  Procedure Laterality Date  . Trigger finger release Right 09/28/2012    Procedure: RELEASE TRIGGER FINGER RIGHT LONG FINGER;  Surgeon: Vickki Hearing, MD;  Location: [facility];  Service: Orthopedics;  Laterality: Right;  Right Long Finger  . Abdominal hysterectomy  2002    [facility]    Family History  Problem Relation Age of Onset  . Heart disease    . Arthritis    . Cancer    . Asthma    . Diabetes     Social History:  reports that she has been smoking Cigarettes.  She has a 27 pack-year smoking history. She does not have any smokeless tobacco history on file. She reports that  drinks alcohol. She reports that she does not use illicit drugs.  Allergies: No Known Allergies  Medications Prior to Admission  Medication Sig Dispense Refill  . Ascorbic Acid Buffered (BUFFERED C POWDER PO) Take 1 packet by mouth 2 (two) times daily as needed (bad migraines).      Marland Kitchen sulfamethoxazole-trimethoprim (BACTRIM DS,SEPTRA DS) 800-160 MG per tablet Take 1 tablet by mouth 2 (two) times daily.  60 tablet  0    No results found for this or any previous visit (from the past 48 hour(s)). No results found.  Review of Systems  Musculoskeletal: Positive for joint pain.  All other systems reviewed and are negative.    Blood pressure 99/58, pulse 65, temperature 98 F (36.7 C), temperature source Oral, resp. rate 15, height 5\' 1"  (1.549 m), weight 152 lb (68.947 kg), SpO2 96.00%. Physical Exam  Nursing note and vitals reviewed. Constitutional: She is oriented to person, place, and time. She appears well-developed and  well-nourished. No distress.  HENT:  Head: Normocephalic and atraumatic.  Eyes: Pupils are equal, round, and reactive to light. Right eye exhibits no discharge. Left eye exhibits no discharge.  Neck: Normal range of motion.  Cardiovascular: Normal rate and intact distal pulses.   Respiratory: Effort normal.  GI: Soft. She exhibits no distension.  Musculoskeletal: Normal range of motion.  Right hand and long finger  ROM normal Flexor tendon strength normal Sensation normal Stability at MCP joint normal Tenderness along the tendon sheath  Neurological: She is alert and oriented to person, place, and time. She has normal reflexes. She displays normal reflexes. She exhibits normal muscle tone.  Skin: She is not diaphoretic. No erythema.  Right hand : incision open at surgical site. Draining   Psychiatric: She has a normal mood and affect. Her behavior is normal. Judgment and thought content normal.     Assessment/Plan Right hand flexor sheath infection  Right hand Incision and drainage   Fuller Canada 11/26/2012, 10:06 AM

## 2012-11-28 ENCOUNTER — Encounter (HOSPITAL_COMMUNITY): Payer: Self-pay | Admitting: Orthopedic Surgery

## 2012-11-28 ENCOUNTER — Telehealth: Payer: Self-pay | Admitting: Orthopedic Surgery

## 2012-11-28 NOTE — Telephone Encounter (Signed)
Patient called, states having transportation problems; daughter's car broke down also - asking if she can postpone her post op#1 appointment until Monday, 12/03/12.  I asked her if she has any other means of transportation, and offered that we can see her any time tomorrow if original appointment time is not good for her.  Asked how hand is feeling.  She said "it hurts."  Okay to wait until Monday if she is unable to get a ride?  Patient's cell # is [phone].

## 2012-11-29 ENCOUNTER — Ambulatory Visit: Payer: BC Managed Care – PPO | Admitting: Orthopedic Surgery

## 2012-12-03 ENCOUNTER — Ambulatory Visit (INDEPENDENT_AMBULATORY_CARE_PROVIDER_SITE_OTHER): Payer: BC Managed Care – PPO | Admitting: Orthopedic Surgery

## 2012-12-03 ENCOUNTER — Encounter: Payer: Self-pay | Admitting: Orthopedic Surgery

## 2012-12-03 VITALS — BP 95/52 | Ht 61.0 in | Wt 152.0 lb

## 2012-12-03 DIAGNOSIS — Z5189 Encounter for other specified aftercare: Secondary | ICD-10-CM

## 2012-12-03 DIAGNOSIS — T814XXD Infection following a procedure, subsequent encounter: Secondary | ICD-10-CM

## 2012-12-03 DIAGNOSIS — M6530 Trigger finger, unspecified finger: Secondary | ICD-10-CM

## 2012-12-03 NOTE — Patient Instructions (Addendum)
OCCUPATIONAL HAND THERAPY [facility] ([facility])  DRESSING DAILY   TAKE ANTIBIOTICS

## 2012-12-03 NOTE — Progress Notes (Signed)
Patient ID: EMRI SAMPLE, female   DOB: 1975-08-14, 36 y.o.   MRN: [id number] BP 95/52  Ht 5\' 1"  (1.549 m)  Wt 152 lb (68.947 kg)  BMI 28.74 kg/m2  Encounter Diagnoses  Name Primary?  Marland Kitchen Acquired trigger finger   . Post op infection, subsequent encounter Yes    Chief Complaint  Patient presents with  . Follow-up    Post op 1 right thumb I & D DOS 11/26/12    The patient missed her last appointment [place]. We removed her regular per catheter. She has some swelling in hand stiffness as expected. No signs of infection however.  Recommend occupational therapy dressing changes daily continue oral antibiotics followup with me to get the sutures removed at postop day 13 of 14

## 2012-12-10 ENCOUNTER — Encounter: Payer: Self-pay | Admitting: Orthopedic Surgery

## 2012-12-10 ENCOUNTER — Ambulatory Visit (INDEPENDENT_AMBULATORY_CARE_PROVIDER_SITE_OTHER): Payer: BC Managed Care – PPO | Admitting: Orthopedic Surgery

## 2012-12-10 VITALS — BP 108/77 | Ht 61.0 in | Wt 152.0 lb

## 2012-12-10 DIAGNOSIS — Z5189 Encounter for other specified aftercare: Secondary | ICD-10-CM

## 2012-12-10 DIAGNOSIS — T814XXD Infection following a procedure, subsequent encounter: Secondary | ICD-10-CM

## 2012-12-10 MED ORDER — HYDROCODONE-ACETAMINOPHEN 10-325 MG PO TABS: 1.0000 | ORAL_TABLET | ORAL | Status: DC | PRN

## 2012-12-10 NOTE — Patient Instructions (Signed)
Finish all antibiotics

## 2012-12-10 NOTE — Progress Notes (Signed)
Patient ID: Valerie Jarvis, female   DOB: 08/13/1975, 37 y.o.   MRN: [id number] Chief Complaint  Patient presents with  . Follow-up    Post op #2, right thumb. DOS 11-26-12.    BP 108/77  Ht 5\' 1"  (1.549 m)  Wt 152 lb (68.947 kg)  BMI 28.74 kg/m2  Encounter Diagnosis  Name Primary?  . Post op infection, subsequent encounter Yes    The patient's wounds have all healed up nicely she has almost regained full range of motion. The sutures are removed. She should finish her antibiotics and come back for wound check in a couple of weeks she can return to work with her wound covered Encounter Diagnosis  Name Primary?  . Post op infection, subsequent encounter Yes

## 2012-12-11 ENCOUNTER — Other Ambulatory Visit: Payer: Self-pay | Admitting: *Deleted

## 2012-12-11 DIAGNOSIS — R110 Nausea: Secondary | ICD-10-CM

## 2012-12-11 MED ORDER — PROMETHAZINE HCL 12.5 MG PO TABS: 12.5000 mg | ORAL_TABLET | Freq: Four times a day (QID) | ORAL | Status: DC | PRN

## 2012-12-25 ENCOUNTER — Ambulatory Visit (INDEPENDENT_AMBULATORY_CARE_PROVIDER_SITE_OTHER): Payer: BC Managed Care – PPO | Admitting: Orthopedic Surgery

## 2012-12-25 VITALS — BP 109/56 | Ht 61.0 in | Wt 152.0 lb

## 2012-12-25 DIAGNOSIS — M79609 Pain in unspecified limb: Secondary | ICD-10-CM

## 2012-12-25 DIAGNOSIS — M79641 Pain in right hand: Secondary | ICD-10-CM

## 2012-12-25 MED ORDER — HYDROCODONE-ACETAMINOPHEN 7.5-325 MG PO TABS: 1.0000 | ORAL_TABLET | ORAL | Status: DC | PRN

## 2012-12-25 NOTE — Patient Instructions (Addendum)
Take pain medication for palm pain

## 2012-12-25 NOTE — Progress Notes (Signed)
Patient ID: Valerie Jarvis, female   DOB: 06-07-1975, 37 y.o.   MRN: [id number] Chief Complaint  Patient presents with  . Follow-up    2 week recheck right thumb DOS 11/30/12    BP 109/56  Ht 5\' 1"  (1.549 m)  Wt 152 lb (68.947 kg)  BMI 28.74 kg/m2   Status post incision and drainage of right hand complains of tenderness over the carpal tunnel incision and slight decrease in extension at the metacarpophalangeal joint of the long finger  There are no signs of infection she has full flexion  Recommend followup as needed

## 2013-02-14 ENCOUNTER — Other Ambulatory Visit: Payer: Self-pay | Admitting: Orthopedic Surgery

## 2013-02-14 MED ORDER — HYDROCODONE-ACETAMINOPHEN 5-325 MG PO TABS: 1.0000 | ORAL_TABLET | Freq: Four times a day (QID) | ORAL | Status: DC | PRN

## 2013-02-18 ENCOUNTER — Ambulatory Visit (INDEPENDENT_AMBULATORY_CARE_PROVIDER_SITE_OTHER): Payer: BC Managed Care – PPO | Admitting: Orthopedic Surgery

## 2013-02-18 ENCOUNTER — Encounter: Payer: Self-pay | Admitting: Orthopedic Surgery

## 2013-02-18 VITALS — BP 120/68 | Ht 61.0 in | Wt 152.0 lb

## 2013-02-18 DIAGNOSIS — L0291 Cutaneous abscess, unspecified: Secondary | ICD-10-CM

## 2013-02-18 DIAGNOSIS — L0390 Cellulitis, unspecified: Secondary | ICD-10-CM

## 2013-02-18 LAB — CBC WITH DIFFERENTIAL/PLATELET
Basophils Absolute: 0 10*3/uL (ref 0.0–0.1)
Basophils Relative: 1 % (ref 0–1)
Eosinophils Absolute: 0.1 10*3/uL (ref 0.0–0.7)
Eosinophils Relative: 2 % (ref 0–5)
HCT: 39.4 % (ref 36.0–46.0)
Hemoglobin: 13.2 g/dL (ref 12.0–15.0)
MCH: 30.5 pg (ref 26.0–34.0)
MCHC: 33.5 g/dL (ref 30.0–36.0)
Monocytes Absolute: 0.6 10*3/uL (ref 0.1–1.0)
Monocytes Relative: 8 % (ref 3–12)
Neutro Abs: 3.8 10*3/uL (ref 1.7–7.7)
RDW: 13.1 % (ref 11.5–15.5)

## 2013-02-18 NOTE — Patient Instructions (Signed)
Lab studies will be ordered

## 2013-02-18 NOTE — Progress Notes (Signed)
Patient ID: Valerie Jarvis, female   DOB: 18-Jan-1976, 37 y.o.   MRN: [id number]  Chief Complaint  Patient presents with  . Follow-up    Right hand swelling DOS 09/28/2012, release trigger finger and second surgery I&D 11/30/12    History: The patient reports pain and swelling when she's using her weed eater with cramping of the right hand and a nodule developing over the volar aspect of the ring finger at the A1 pulley, noted since approximately a week and a half after her last visit  Review of systems: No fever or chills no numbness or tingling  She was treated with antibiotics using Bactrim after her second surgery and incision and drainage after her initial sutures broke loose.   BP 120/68  Ht 5\' 1"  (1.549 m)  Wt 152 lb (68.947 kg)  BMI 28.74 kg/m2 General appearance is normal, the patient is alert and oriented x3 with normal mood and affect. Stability of the digits are normal motor functions normal pulse and perfusion normal sensation normal There is no redness in the hand there is tightness of the right long finger with a slight flexion at the metacarpophalangeal joint but she does not have redness fusiform swelling or flexion posturing of the finger there is no tenderness over the carpal tunnel forearm and there are no epitrochlear lymph nodes  Impression possible cellulitis recommend laboratory studies come back on Thursday reassess. If labs are normal treat for inflammation if they're abnormal treat for infection

## 2013-02-19 LAB — C-REACTIVE PROTEIN: CRP: <0.5 mg/dL (ref <0.60)

## 2013-02-21 ENCOUNTER — Encounter: Payer: Self-pay | Admitting: Orthopedic Surgery

## 2013-02-21 ENCOUNTER — Ambulatory Visit (INDEPENDENT_AMBULATORY_CARE_PROVIDER_SITE_OTHER): Payer: BC Managed Care – PPO | Admitting: Orthopedic Surgery

## 2013-02-21 DIAGNOSIS — M659 Synovitis and tenosynovitis, unspecified: Secondary | ICD-10-CM

## 2013-02-21 DIAGNOSIS — M79609 Pain in unspecified limb: Secondary | ICD-10-CM

## 2013-02-21 DIAGNOSIS — M65949 Unspecified synovitis and tenosynovitis, unspecified hand: Secondary | ICD-10-CM

## 2013-02-21 DIAGNOSIS — M65839 Other synovitis and tenosynovitis, unspecified forearm: Secondary | ICD-10-CM

## 2013-02-21 MED ORDER — HYDROCODONE-ACETAMINOPHEN 5-325 MG PO TABS: 1.0000 | ORAL_TABLET | Freq: Four times a day (QID) | ORAL | Status: DC | PRN

## 2013-02-21 MED ORDER — PREDNISONE (PAK) 10 MG PO TABS: 10.0000 mg | ORAL_TABLET | Freq: Three times a day (TID) | ORAL | Status: DC

## 2013-02-21 NOTE — Progress Notes (Signed)
Patient ID: Valerie Jarvis, female   DOB: August 14, 1975, 37 y.o.   MRN: [id number]  Chief Complaint  Patient presents with  . Follow-up    3 day recheck with lab results.    Status post trigger finger release conjugated by infection treated with irrigation debridement oral antibiotics presents back several months later with pain over the long and ring finger A1 pulleys  Denies fever or drainage  Lab studies taken and reveal normal sedimentation rate normal C-reactive protein normal white count  Lab studies reviewed  Prescription medication prednisone for inflammation Norco for pain return in 30 days reassess  Meds ordered this encounter  Medications  . predniSONE (STERAPRED UNI-PAK) 10 MG tablet    Sig: Take 1 tablet (10 mg total) by mouth 3 (three) times daily.    Dispense:  90 tablet    Refill:  1  . HYDROcodone-acetaminophen (NORCO/VICODIN) 5-325 MG per tablet    Sig: Take 1 tablet by mouth every 6 (six) hours as needed for pain.    Dispense:  120 tablet    Refill:  0    Last prescription   Encounter Diagnoses  Name Primary?  . Pain, hand, unspecified laterality Yes  . Tenosynovitis of finger    \

## 2013-02-21 NOTE — Patient Instructions (Signed)
Start prednisone 10 mg 3 x a day   Continue hydrocodone   Try to rest your hand   Return in 30 days

## 2013-02-25 ENCOUNTER — Ambulatory Visit: Payer: BC Managed Care – PPO | Admitting: Orthopedic Surgery

## 2013-03-21 ENCOUNTER — Ambulatory Visit (INDEPENDENT_AMBULATORY_CARE_PROVIDER_SITE_OTHER): Payer: BC Managed Care – PPO | Admitting: Orthopedic Surgery

## 2013-03-21 DIAGNOSIS — M79609 Pain in unspecified limb: Secondary | ICD-10-CM

## 2013-03-21 MED ORDER — HYDROCODONE-ACETAMINOPHEN 5-325 MG PO TABS: 1.0000 | ORAL_TABLET | Freq: Four times a day (QID) | ORAL | Status: DC | PRN

## 2013-03-21 NOTE — Patient Instructions (Signed)
Continue prednisone x 4 weeks   Take medicine for pain as needed  If your breathing worsen go to ER immediately

## 2013-03-21 NOTE — Progress Notes (Signed)
Patient ID: Valerie Jarvis, female   DOB: 08-Sep-1975, 37 y.o.   MRN: [id number]  Chief Complaint  Patient presents with  . Follow-up     tenosynovitis / non infectious  right hand and thumb    This patient had a trigger finger release. Develop wound infection. An incision and drainage. Has a persistent pain. Infectious markers negative. Currently on prednisone and hydrocodone. Still complains of pain over the A1 pulley carpal tunnel and proximal forearm  No other signs of infection no redness. Flexion extension normal.  Tenosynovitis  Recommend continue prednisone and Norco for pain followup in 4 weeks  Addendum the patient fell today landing on her left rib cage she does complain of some shortness of breath she is advised to go to the emergency room she doesn't want to go. She is advised to go if the breathing, breathing is any worse.

## 2013-03-24 ENCOUNTER — Encounter (HOSPITAL_COMMUNITY): Payer: Self-pay | Admitting: Emergency Medicine

## 2013-03-24 ENCOUNTER — Emergency Department (HOSPITAL_COMMUNITY): Payer: BC Managed Care – PPO

## 2013-03-24 ENCOUNTER — Emergency Department (HOSPITAL_COMMUNITY)
Admission: EM | Admit: 2013-03-24 | Discharge: 2013-03-24 | Disposition: A | Discharge status: Home / Self Care | Payer: BC Managed Care – PPO | Attending: Emergency Medicine | Admitting: Emergency Medicine

## 2013-03-24 DIAGNOSIS — S2232XA Fracture of one rib, left side, initial encounter for closed fracture: Secondary | ICD-10-CM

## 2013-03-24 DIAGNOSIS — S2239XA Fracture of one rib, unspecified side, initial encounter for closed fracture: Secondary | ICD-10-CM | POA: Insufficient documentation

## 2013-03-24 DIAGNOSIS — Y9389 Activity, other specified: Secondary | ICD-10-CM | POA: Insufficient documentation

## 2013-03-24 DIAGNOSIS — Z792 Long term (current) use of antibiotics: Secondary | ICD-10-CM | POA: Insufficient documentation

## 2013-03-24 DIAGNOSIS — IMO0002 Reserved for concepts with insufficient information to code with codable children: Secondary | ICD-10-CM | POA: Insufficient documentation

## 2013-03-24 DIAGNOSIS — Y929 Unspecified place or not applicable: Secondary | ICD-10-CM | POA: Insufficient documentation

## 2013-03-24 DIAGNOSIS — F17200 Nicotine dependence, unspecified, uncomplicated: Secondary | ICD-10-CM | POA: Insufficient documentation

## 2013-03-24 DIAGNOSIS — X500XXA Overexertion from strenuous movement or load, initial encounter: Secondary | ICD-10-CM | POA: Insufficient documentation

## 2013-03-24 MED ORDER — OXYCODONE-ACETAMINOPHEN 5-325 MG PO TABS
1.0000 | ORAL_TABLET | Freq: Once | ORAL | Status: AC
  Administered 2013-03-24: 1 via ORAL
  Filled 2013-03-24: qty 1

## 2013-03-24 MED ORDER — ONDANSETRON 8 MG PO TBDP
8.0000 mg | ORAL_TABLET | Freq: Once | ORAL | Status: AC
  Administered 2013-03-24: 8 mg via ORAL
  Filled 2013-03-24: qty 1

## 2013-03-24 MED ORDER — OXYCODONE-ACETAMINOPHEN 5-325 MG PO TABS: 1.0000 | ORAL_TABLET | ORAL | Status: DC | PRN

## 2013-03-24 MED ORDER — NAPROXEN 500 MG PO TABS: 500.0000 mg | ORAL_TABLET | Freq: Two times a day (BID) | ORAL | Status: DC

## 2013-03-24 NOTE — ED Provider Notes (Signed)
CSN: [id number]     Arrival date & time 03/24/13  2040 History   First MD Initiated Contact with Patient 03/24/13 2123     Chief Complaint  Patient presents with  . Rib Injury   (Consider location/radiation/quality/duration/timing/severity/associated sxs/prior Treatment) The history is provided by the patient.   Valerie Jarvis is a 37 y.o. female who presents to the ED with left rib pain. The pain started 3 days ago. The onset was sudden. She rates her pain as 10/10.  The pain started when she bent down and felt a pop. States that last week she was getting into a truck and hit her right ribs. She denies nausea or vomiting, fever or chills or abdominal pain.   Past Medical History  Diagnosis Date  . [id number](478.2)    Past Surgical History  Procedure Laterality Date  . Trigger finger release Right 09/28/2012    Procedure: RELEASE TRIGGER FINGER RIGHT LONG FINGER;  Surgeon: Vickki Hearing, MD;  Location: [facility];  Service: Orthopedics;  Laterality: Right;  Right Long Finger  . Abdominal hysterectomy  2002    [facility]  . Incision and drainage Right 11/26/2012    Procedure: INCISION AND DRAINAGE RIGHT LONG FINGER;  Surgeon: Vickki Hearing, MD;  Location: [facility];  Service: Orthopedics;  Laterality: Right;  Right Hand Incision and Drainage   Family History  Problem Relation Age of Onset  . Heart disease    . Arthritis    . Cancer    . Asthma    . Diabetes     History  Substance Use Topics  . Smoking status: Current Every Day Smoker -- 1.00 packs/day for 27 years    Types: Cigarettes  . Smokeless tobacco: Not on file  . Alcohol Use: Yes     Comment: occaisonal use   OB History   Grav Para Term Preterm Abortions TAB SAB Ect Mult Living                 Review of Systems  Constitutional: Negative for fever and chills.  HENT: Negative.   Eyes: Negative for visual disturbance.  Respiratory: Negative for cough.   Cardiovascular:       Rib pain   Gastrointestinal:  Negative for nausea, vomiting and abdominal pain.  Genitourinary: Negative for dysuria, urgency and frequency.  Musculoskeletal:       Rib pain   Skin: Negative for wound.  Neurological: Negative for headaches.  Psychiatric/Behavioral: The patient is not nervous/anxious.     Allergies  Review of patient's allergies indicates no known allergies.  Home Medications   Current Outpatient Rx  Name  Route  Sig  Dispense  Refill  . Ascorbic Acid Buffered (BUFFERED C POWDER PO)   Oral   Take 1 packet by mouth 2 (two) times daily as needed (bad migraines).         Marland Kitchen HYDROcodone-acetaminophen (NORCO/VICODIN) 5-325 MG per tablet   Oral   Take 1 tablet by mouth every 6 (six) hours as needed.   120 tablet   0     Last prescription   . predniSONE (STERAPRED UNI-PAK) 10 MG tablet   Oral   Take 1 tablet (10 mg total) by mouth 3 (three) times daily.   90 tablet   1   . promethazine (PHENERGAN) 12.5 MG tablet   Oral   Take 1 tablet (12.5 mg total) by mouth every 6 (six) hours as needed for nausea.   30 tablet   5   .  sulfamethoxazole-trimethoprim (BACTRIM DS,SEPTRA DS) 800-160 MG per tablet   Oral   Take 1 tablet by mouth 2 (two) times daily.   60 tablet   0     Take twice a day for 30 days    BP 128/72  Pulse 89  Temp(Src) 98 F (36.7 C) (Oral)  Resp 20  Ht 5\' 1"  (1.549 m)  Wt 145 lb (65.772 kg)  BMI 27.41 kg/m2  SpO2 94% Physical Exam  Nursing note and vitals reviewed. Constitutional: She is oriented to person, place, and time. She appears well-developed and well-nourished. No distress.  HENT:  Head: Normocephalic and atraumatic.  Eyes: Conjunctivae and EOM are normal.  Neck: Normal range of motion. Neck supple.  Cardiovascular: Normal rate and regular rhythm.   Pulmonary/Chest: Effort normal and breath sounds normal.    Bilateral anterior rib tenderness with palpation.  Abdominal: Soft. There is no tenderness.  Musculoskeletal: Normal range of motion.    Neurological: She is alert and oriented to person, place, and time. No cranial nerve deficit.  Skin: Skin is warm and dry.  Psychiatric: She has a normal mood and affect. Her behavior is normal.    ED Course  Procedures Dg Ribs Unilateral W/chest Left  03/24/2013   CLINICAL DATA:  Left anterior rib pain following an injury.  EXAM: LEFT RIBS AND CHEST - 3+ VIEW  COMPARISON:  Chest radiographs dated 01/28/2010.  FINDINGS: Normal sized heart. Interval linear density at the right lung base. Mildly displaced fracture of the anterior aspect of the left 9th rib. No pneumothorax.  IMPRESSION: 1. Mildly displaced left anterior 9th rib fracture. 2. Interval right basilar linear atelectasis or scarring.   Electronically Signed   By: Gordan Payment M.D.   On: 03/24/2013 23:09    MDM  37 y.o. female with right rib pain x one week s/p injury and left rib pain x 3 days after feeling a pop when she bent down. Stable for discharge without any immediate complications. I have reviewed this patient's vital signs, nurses notes, appropriate labs and imaging.  I have discussed findings and plan of care with the patient. She voices understanding.    Medication List    TAKE these medications       naproxen 500 MG tablet  Commonly known as:  NAPROSYN  Take 1 tablet (500 mg total) by mouth 2 (two) times daily.     oxyCODONE-acetaminophen 5-325 MG per tablet  Commonly known as:  ROXICET  Take 1 tablet by mouth every 4 (four) hours as needed for severe pain.      ASK your doctor about these medications       BUFFERED C POWDER PO  Take 1 packet by mouth 2 (two) times daily as needed (bad migraines).     promethazine 12.5 MG tablet  Commonly known as:  PHENERGAN  Take 1 tablet (12.5 mg total) by mouth every 6 (six) hours as needed for nausea.           Janne Napoleon, [place] 03/24/13 [phone]

## 2013-03-24 NOTE — ED Notes (Signed)
Pt reports she believes she cracked a rib, pain on left side.

## 2013-03-24 NOTE — ED Provider Notes (Signed)
Medical screening examination/treatment/procedure(s) were performed by non-physician practitioner and as supervising physician I was immediately available for consultation/collaboration.  EKG Interpretation   None         Benny Lennert, MD 03/24/13 [phone]

## 2013-03-26 ENCOUNTER — Ambulatory Visit: Payer: BC Managed Care – PPO | Admitting: Orthopedic Surgery

## 2013-04-18 ENCOUNTER — Ambulatory Visit (INDEPENDENT_AMBULATORY_CARE_PROVIDER_SITE_OTHER): Payer: BC Managed Care – PPO | Admitting: Orthopedic Surgery

## 2013-04-18 ENCOUNTER — Encounter: Payer: Self-pay | Admitting: Orthopedic Surgery

## 2013-04-18 VITALS — BP 114/82 | Ht 61.0 in | Wt 152.0 lb

## 2013-04-18 DIAGNOSIS — T814XXD Infection following a procedure, subsequent encounter: Secondary | ICD-10-CM

## 2013-04-18 DIAGNOSIS — M659 Synovitis and tenosynovitis, unspecified: Secondary | ICD-10-CM

## 2013-04-18 DIAGNOSIS — Z5189 Encounter for other specified aftercare: Secondary | ICD-10-CM

## 2013-04-18 DIAGNOSIS — M65839 Other synovitis and tenosynovitis, unspecified forearm: Secondary | ICD-10-CM

## 2013-04-18 MED ORDER — HYDROCODONE-ACETAMINOPHEN 5-325 MG PO TABS
1.0000 | ORAL_TABLET | Freq: Four times a day (QID) | ORAL | Status: DC | PRN
Start: 2013-04-18 — End: 2014-11-10

## 2013-04-18 NOTE — Progress Notes (Signed)
Patient ID: Valerie Jarvis, female   DOB: September 13, 1975, 37 y.o.   MRN: [id number] Chief Complaint  Patient presents with  . Follow-up    4 week recheck on right hand and thumb. DOS 11-26-12.   This patient is status post trigger finger which became infection had irrigation debridement for postop infection developed a tenosynovitis and presents now for followup she does indicate on review of systems that she did fracture her ribs had shortness of breath and went to the hospital took hydrocodone for pain comes in complaining of pain over the carpal incision especially with grip. She's regained all her motion she does have some numbness from the PIP joint distally and the long finger  Exam vitals are stable appearance is normal she is oriented x3 her mood is pleasant she is a scar over the carpal tunnel from the surgery and a scar over the long finger A1 pulley there is a nodule over the A1 pulley of the ring finger but there is no triggering. Her wound is sensitive.  Recommend hydrocodone for pain  Massage the scar to desensitize it  Followup as needed Meds ordered this encounter  Medications  . HYDROcodone-acetaminophen (NORCO/VICODIN) 5-325 MG per tablet    Sig: Take 1 tablet by mouth every 6 (six) hours as needed for moderate pain.    Dispense:  90 tablet    Refill:  0

## 2013-04-18 NOTE — Patient Instructions (Signed)
Scar massage

## 2014-11-10 ENCOUNTER — Emergency Department (HOSPITAL_COMMUNITY)
Admission: EM | Admit: 2014-11-10 | Discharge: 2014-11-11 | Disposition: A | Discharge status: Home / Self Care | Payer: Self-pay | Attending: Emergency Medicine | Admitting: Emergency Medicine

## 2014-11-10 ENCOUNTER — Emergency Department (HOSPITAL_COMMUNITY): Payer: Self-pay

## 2014-11-10 ENCOUNTER — Encounter (HOSPITAL_COMMUNITY): Payer: Self-pay | Admitting: *Deleted

## 2014-11-10 DIAGNOSIS — J159 Unspecified bacterial pneumonia: Secondary | ICD-10-CM | POA: Insufficient documentation

## 2014-11-10 DIAGNOSIS — Z3202 Encounter for pregnancy test, result negative: Secondary | ICD-10-CM | POA: Insufficient documentation

## 2014-11-10 DIAGNOSIS — J189 Pneumonia, unspecified organism: Secondary | ICD-10-CM

## 2014-11-10 DIAGNOSIS — Z720 Tobacco use: Secondary | ICD-10-CM | POA: Insufficient documentation

## 2014-11-10 LAB — COMPREHENSIVE METABOLIC PANEL
ALBUMIN: 4.3 g/dL (ref 3.5–5.0)
ALK PHOS: 103 U/L (ref 38–126)
ALT: 11 U/L — ABNORMAL LOW (ref 14–54)
AST: 16 U/L (ref 15–41)
Anion gap: 12 (ref 5–15)
BILIRUBIN TOTAL: 0.5 mg/dL (ref 0.3–1.2)
BUN: 11 mg/dL (ref 6–20)
CHLORIDE: 101 mmol/L (ref 101–111)
CO2: 21 mmol/L — ABNORMAL LOW (ref 22–32)
Calcium: 9 mg/dL (ref 8.9–10.3)
Creatinine, Ser: 0.98 mg/dL (ref 0.44–1.00)
GFR calc Af Amer: >60 mL/min (ref >60)
GFR calc non Af Amer: >60 mL/min (ref >60)
Glucose, Bld: 109 mg/dL — ABNORMAL HIGH (ref 65–99)
Potassium: 3.3 mmol/L — ABNORMAL LOW (ref 3.5–5.1)
Sodium: 134 mmol/L — ABNORMAL LOW (ref 135–145)
TOTAL PROTEIN: 8 g/dL (ref 6.5–8.1)

## 2014-11-10 LAB — CBC WITH DIFFERENTIAL/PLATELET
Basophils Absolute: 0 10*3/uL (ref 0.0–0.1)
Basophils Relative: 0 % (ref 0–1)
EOS ABS: 0 10*3/uL (ref 0.0–0.7)
EOS PCT: 0 % (ref 0–5)
HEMATOCRIT: 39.4 % (ref 36.0–46.0)
Hemoglobin: 13.2 g/dL (ref 12.0–15.0)
LYMPHS PCT: 10 % — AB (ref 12–46)
Lymphs Abs: 1.4 10*3/uL (ref 0.7–4.0)
MCH: 30.7 pg (ref 26.0–34.0)
MCHC: 33.5 g/dL (ref 30.0–36.0)
MCV: 91.6 fL (ref 78.0–100.0)
Monocytes Absolute: 1.4 10*3/uL — ABNORMAL HIGH (ref 0.1–1.0)
Monocytes Relative: 10 % (ref 3–12)
Neutro Abs: 11.1 10*3/uL — ABNORMAL HIGH (ref 1.7–7.7)
Neutrophils Relative %: 80 % — ABNORMAL HIGH (ref 43–77)
PLATELETS: 251 10*3/uL (ref 150–400)
RBC: 4.3 MIL/uL (ref 3.87–5.11)
RDW: 12.8 % (ref 11.5–15.5)
WBC: 13.9 10*3/uL — AB (ref 4.0–10.5)

## 2014-11-10 LAB — TROPONIN I: Troponin I: <0.03 ng/mL (ref <0.031)

## 2014-11-10 LAB — PREGNANCY, URINE: Preg Test, Ur: NEGATIVE

## 2014-11-10 LAB — D-DIMER, QUANTITATIVE (NOT AT ARMC): D-Dimer, Quant: 0.47 ug/mL-FEU (ref 0.00–0.48)

## 2014-11-10 MED ORDER — DEXTROSE 5 % IV SOLN
500.0000 mg | Freq: Once | INTRAVENOUS | Status: AC
  Administered 2014-11-10: 500 mg via INTRAVENOUS
  Filled 2014-11-10: qty 500

## 2014-11-10 MED ORDER — IOHEXOL 300 MG/ML  SOLN
75.0000 mL | Freq: Once | INTRAMUSCULAR | Status: AC | PRN
  Administered 2014-11-10: 75 mL via INTRAVENOUS

## 2014-11-10 MED ORDER — DEXTROSE 5 % IV SOLN
1.0000 g | Freq: Once | INTRAVENOUS | Status: AC
  Administered 2014-11-10: 1 g via INTRAVENOUS
  Filled 2014-11-10: qty 10

## 2014-11-10 MED ORDER — AZITHROMYCIN 250 MG PO TABS: ORAL_TABLET | ORAL | Status: AC

## 2014-11-10 MED ORDER — ACETAMINOPHEN 500 MG PO TABS
1000.0000 mg | ORAL_TABLET | Freq: Once | ORAL | Status: AC
  Administered 2014-11-10: 1000 mg via ORAL
  Filled 2014-11-10: qty 2

## 2014-11-10 NOTE — Discharge Instructions (Signed)
Chest x-ray and CT scan suggests pneumonia. However it is recommended to have a repeat chest x-ray in 3-4 weeks to make sure this area has resolved. This can be coordinated by your primary care doctor. Start oral antibiotic tomorrow evening. Follow-up your primary care doctor topics smoking. Tylenol or ibuprofen for pain.

## 2014-11-10 NOTE — ED Notes (Signed)
3 day history of L chest pain from shoulder to below breast and around to back. Hurts to take deep breath.  States she was not doing anything in particular when pain started.  It is constant. Reports SOB, n/v, lightheadedness.

## 2014-11-10 NOTE — ED Notes (Signed)
Pt given ginger ale.

## 2014-11-10 NOTE — ED Provider Notes (Signed)
CSN: [id number]     Arrival date & time 11/10/14  1505 History   First MD Initiated Contact with Patient 11/10/14 1859     Chief Complaint  Patient presents with  . Chest Pain     (Consider location/radiation/quality/duration/timing/severity/associated sxs/prior Treatment) HPI..... Left-sided chest pain for 3 days with mild dyspnea; no diaphoresis, nausea. Patient is smoker. She claims no chronic health care problems. No personal history of coronary artery disease. No hypertension or diabetes. No cough or rusty sputum. Low-grade fever.  Past Medical History  Diagnosis Date  . [id number](272.5)    Past Surgical History  Procedure Laterality Date  . Trigger finger release Right 09/28/2012    Procedure: RELEASE TRIGGER FINGER RIGHT LONG FINGER;  Surgeon: Vickki Hearing, MD;  Location: [facility];  Service: Orthopedics;  Laterality: Right;  Right Long Finger  . Abdominal hysterectomy  2002    [facility]  . Incision and drainage Right 11/26/2012    Procedure: INCISION AND DRAINAGE RIGHT LONG FINGER;  Surgeon: Vickki Hearing, MD;  Location: [facility];  Service: Orthopedics;  Laterality: Right;  Right Hand Incision and Drainage   Family History  Problem Relation Age of Onset  . Heart disease    . Arthritis    . Cancer    . Asthma    . Diabetes     History  Substance Use Topics  . Smoking status: Current Every Day Smoker -- 2.00 packs/day for 27 years    Types: Cigarettes  . Smokeless tobacco: Not on file  . Alcohol Use: Yes     Comment: occaisonal use   OB History    No data available     Review of Systems  All other systems reviewed and are negative.     Allergies  Review of patient's allergies indicates no known allergies.  Home Medications   Prior to Admission medications   Medication Sig Start Date End Date Taking? Authorizing Provider  dimenhyDRINATE (DRAMAMINE) 50 MG tablet Take 50 mg by mouth every 8 (eight) hours as needed.   Yes Historical Provider, MD   diphenhydrAMINE (BENADRYL) 25 MG tablet Take 100 mg by mouth every 6 (six) hours as needed.   Yes Historical Provider, MD  ibuprofen (ADVIL,MOTRIN) 200 MG tablet Take 800 mg by mouth every 6 (six) hours as needed.   Yes Historical Provider, MD  azithromycin (ZITHROMAX) 250 MG tablet 2 tablets day 1, 1 tablet day 2 through 5 11/10/14   [name], MD  azithromycin [facility]) 250 MG tablet Two tabs day one:  One tab day two thru five 11/10/14   [name], MD   BP 100/65 mmHg  Pulse 90  Temp(Src) 100.3 F (37.9 C) (Oral)  Resp 26  Ht  (1.549 m)  Wt 143 lb (64.864 kg)  BMI 27.03 kg/m2  SpO2 96% Physical Exam  Constitutional: She is oriented to person, place, and time. She appears well-developed and well-nourished.  HENT:  Head: Normocephalic and atraumatic.  Eyes: Conjunctivae and EOM are normal. Pupils are equal, round, and reactive to light.  Neck: Normal range of motion. Neck supple.  Cardiovascular: Normal rate and regular rhythm.   Pulmonary/Chest: Effort normal and breath sounds normal.  Abdominal: Soft. Bowel sounds are normal.  Musculoskeletal: Normal range of motion.  Neurological: She is alert and oriented to person, place, and time.  Skin: Skin is warm and dry.  Psychiatric: She has a normal mood and affect. Her behavior is normal.  Nursing note and vitals reviewed.  ED Course  Procedures (including critical care time) Labs Review Labs Reviewed  CBC WITH DIFFERENTIAL/PLATELET - Abnormal; Notable for the following:    WBC 13.9 (*)    Neutrophils Relative % 80 (*)    Neutro Abs 11.1 (*)    Lymphocytes Relative 10 (*)    Monocytes Absolute 1.4 (*)    All other components within normal limits  COMPREHENSIVE METABOLIC PANEL - Abnormal; Notable for the following:    Sodium 134 (*)    Potassium 3.3 (*)    CO2 21 (*)    Glucose, Bld 109 (*)    ALT 11 (*)    All other components within normal limits  D-DIMER, QUANTITATIVE (NOT AT [facility])  TROPONIN I  PREGNANCY,  URINE    Imaging Review Dg Chest 2 View  11/10/2014   CLINICAL DATA:  Left-sided chest pain, cough and congestion. Nausea, vomiting and body aches. Smoker. Symptoms for 3 days.  EXAM: CHEST  2 VIEW  COMPARISON:  PA and lateral chest 01/28/2010 and single view of the chest 03/24/2013.  FINDINGS: A masslike opacity is identified in the lingula measuring 3.6 cm in diameter. The right lung is clear. Heart size is normal. No pneumothorax or pleural effusion.  IMPRESSION: Opacity in the lingula could be due to pneumonia but has an appearance worrisome for mass. Chest CT with contrast is recommended further evaluation.   Electronically Signed   By: Drusilla Kanner M.D.   On: 11/10/2014 15:46   Ct Chest W Contrast  11/10/2014   CLINICAL DATA:  Abnormal chest x-ray. Chest pain and cough for 3 days. Smoker. Elevated white blood cell count.  EXAM: CT CHEST WITH CONTRAST  TECHNIQUE: Multidetector CT imaging of the chest was performed during intravenous contrast administration.  CONTRAST:  75 mL OMNIPAQUE IOHEXOL 300 MG/ML  SOLN  COMPARISON:  PA and lateral chest earlier today. CT chest 11/27/2004.  FINDINGS: There is no axillary, hilar or mediastinal lymphadenopathy. No pleural or pericardial effusion. Heart size is normal. Lungs demonstrate focal opacity in the lingula. Linear opacity in the anterior right upper lobe is likely due to scar. The lungs are otherwise clear. Incidentally imaged upper abdomen appears normal. No focal bony abnormality is identified.  IMPRESSION: Focal airspace opacity in the lingula is likely due to pneumonia but cannot be definitively characterized. Repeat follow-up plain films to clearing are recommended.   Electronically Signed   By: Drusilla Kanner M.D.   On: 11/10/2014 20:03     EKG Interpretation None      Date: 11/10/2014  Rate: 97  Rhythm: normal sinus rhythm  QRS Axis: normal  Intervals: normal  ST/T Wave abnormalities: normal  Conduction Disutrbances: none  Narrative  Interpretation: unremarkable     MDM   Final diagnoses:  Community acquired pneumonia    Chest x-ray followed by CT chest suggests a probable focal air space opacity in the lingula.  IV Zithromax, IV Rocephin. Discharge medications Zithromax. Patient understands it is imperative that she get a repeat chest x-ray and several weeks. Possibility of cancer discussed with patient and her significant other and daughter    [name], MD 11/10/14 2206

## 2017-03-20 ENCOUNTER — Emergency Department (HOSPITAL_COMMUNITY): Payer: Self-pay

## 2017-03-20 ENCOUNTER — Emergency Department (HOSPITAL_COMMUNITY)
Admission: EM | Admit: 2017-03-20 | Discharge: 2017-03-21 | Disposition: A | Discharge status: Home / Self Care | Payer: Self-pay | Attending: Emergency Medicine | Admitting: Emergency Medicine

## 2017-03-20 ENCOUNTER — Encounter (HOSPITAL_COMMUNITY): Payer: Self-pay

## 2017-03-20 ENCOUNTER — Other Ambulatory Visit: Payer: Self-pay

## 2017-03-20 DIAGNOSIS — R112 Nausea with vomiting, unspecified: Secondary | ICD-10-CM

## 2017-03-20 DIAGNOSIS — R1031 Right lower quadrant pain: Secondary | ICD-10-CM

## 2017-03-20 DIAGNOSIS — F17210 Nicotine dependence, cigarettes, uncomplicated: Secondary | ICD-10-CM | POA: Insufficient documentation

## 2017-03-20 DIAGNOSIS — Z79899 Other long term (current) drug therapy: Secondary | ICD-10-CM | POA: Insufficient documentation

## 2017-03-20 DIAGNOSIS — K529 Noninfective gastroenteritis and colitis, unspecified: Secondary | ICD-10-CM | POA: Insufficient documentation

## 2017-03-20 DIAGNOSIS — R197 Diarrhea, unspecified: Secondary | ICD-10-CM

## 2017-03-20 LAB — CBC
HCT: 41.6 % (ref 36.0–46.0)
Hemoglobin: 13.8 g/dL (ref 12.0–15.0)
MCH: 30.5 pg (ref 26.0–34.0)
MCHC: 33.2 g/dL (ref 30.0–36.0)
MCV: 91.8 fL (ref 78.0–100.0)
Platelets: 389 10*3/uL (ref 150–400)
RBC: 4.53 MIL/uL (ref 3.87–5.11)
RDW: 12.8 % (ref 11.5–15.5)
WBC: 9.1 10*3/uL (ref 4.0–10.5)

## 2017-03-20 LAB — URINALYSIS, ROUTINE W REFLEX MICROSCOPIC
BILIRUBIN URINE: NEGATIVE
Glucose, UA: NEGATIVE mg/dL
Hgb urine dipstick: NEGATIVE
KETONES UR: NEGATIVE mg/dL
Leukocytes, UA: NEGATIVE
Nitrite: NEGATIVE
Protein, ur: NEGATIVE mg/dL
Specific Gravity, Urine: 1.015 (ref 1.005–1.030)
pH: 6 (ref 5.0–8.0)

## 2017-03-20 LAB — COMPREHENSIVE METABOLIC PANEL
ALBUMIN: 4.1 g/dL (ref 3.5–5.0)
ALK PHOS: 102 U/L (ref 38–126)
ALT: 13 U/L — ABNORMAL LOW (ref 14–54)
AST: 17 U/L (ref 15–41)
Anion gap: 6 (ref 5–15)
BILIRUBIN TOTAL: 0.4 mg/dL (ref 0.3–1.2)
BUN: 9 mg/dL (ref 6–20)
CO2: 26 mmol/L (ref 22–32)
Calcium: 9.4 mg/dL (ref 8.9–10.3)
Chloride: 107 mmol/L (ref 101–111)
Creatinine, Ser: 0.81 mg/dL (ref 0.44–1.00)
GFR calc Af Amer: >60 mL/min (ref >60)
GLUCOSE: 86 mg/dL (ref 65–99)
Potassium: 5.1 mmol/L (ref 3.5–5.1)
Sodium: 139 mmol/L (ref 135–145)
TOTAL PROTEIN: 7.4 g/dL (ref 6.5–8.1)

## 2017-03-20 LAB — LIPASE, BLOOD: Lipase: 20 U/L (ref 11–51)

## 2017-03-20 MED ORDER — DICYCLOMINE HCL 20 MG PO TABS: 20.0000 mg | ORAL_TABLET | Freq: Two times a day (BID) | ORAL | 0 refills | Status: AC | PRN

## 2017-03-20 MED ORDER — MORPHINE SULFATE (PF) 4 MG/ML IV SOLN
4.0000 mg | Freq: Once | INTRAVENOUS | Status: AC
  Administered 2017-03-20: 4 mg via INTRAVENOUS
  Filled 2017-03-20: qty 1

## 2017-03-20 MED ORDER — MORPHINE SULFATE (PF) 4 MG/ML IV SOLN
2.0000 mg | Freq: Once | INTRAVENOUS | Status: AC
  Administered 2017-03-20: 2 mg via INTRAVENOUS
  Filled 2017-03-20: qty 1

## 2017-03-20 MED ORDER — KETOROLAC TROMETHAMINE 30 MG/ML IJ SOLN
30.0000 mg | Freq: Once | INTRAMUSCULAR | Status: AC
  Administered 2017-03-20: 30 mg via INTRAVENOUS
  Filled 2017-03-20: qty 1

## 2017-03-20 MED ORDER — PROMETHAZINE HCL 25 MG PO TABS: 25.0000 mg | ORAL_TABLET | Freq: Four times a day (QID) | ORAL | 0 refills | Status: DC | PRN

## 2017-03-20 MED ORDER — SODIUM CHLORIDE 0.9 % IV BOLUS (SEPSIS)
1000.0000 mL | Freq: Once | INTRAVENOUS | Status: AC
  Administered 2017-03-20: 1000 mL via INTRAVENOUS

## 2017-03-20 MED ORDER — ONDANSETRON HCL 4 MG/2ML IJ SOLN
4.0000 mg | Freq: Once | INTRAMUSCULAR | Status: AC
  Administered 2017-03-20: 4 mg via INTRAVENOUS
  Filled 2017-03-20: qty 2

## 2017-03-20 MED ORDER — DICYCLOMINE HCL 10 MG/ML IM SOLN
20.0000 mg | Freq: Once | INTRAMUSCULAR | Status: AC
  Administered 2017-03-20: 20 mg via INTRAMUSCULAR
  Filled 2017-03-20: qty 2

## 2017-03-20 MED ORDER — IOPAMIDOL (ISOVUE-300) INJECTION 61%
INTRAVENOUS | Status: AC
  Administered 2017-03-20: 100 mL via INTRAVENOUS
  Filled 2017-03-20: qty 100

## 2017-03-20 NOTE — ED Notes (Signed)
Called CT - next on list

## 2017-03-20 NOTE — ED Notes (Signed)
Patient transported to CT 

## 2017-03-20 NOTE — ED Triage Notes (Signed)
Pt reports RLQ x 1 week very tender to palpation. Endorses n/v, anorexia. Pt endorses temp of 101 at home that she took motrin for.

## 2017-03-20 NOTE — ED Provider Notes (Signed)
11:11 PM Patient care assumed from [facility] Fawze, PA-C at shift change pending CT scan to evaluate for the cause of abdominal pain in the setting of vomiting and diarrhea.  CT has been reviewed, showing findings of enteritis.  Vomiting and diarrhea with abdominal pain is consistent with likely gastroenteritis of viral etiology.  We will manage supportively.  Appendix on CT, specifically, is normal.  I do not believe further emergent workup is indicated at this time.  Patient discharged with prescription for Bentyl and Phenergan.  Primary care follow-up advised and return precautions given.  Patient discharged in stable condition with no unaddressed concerns.   Vitals:   03/20/17 1356 03/20/17 2207  BP: 108/70 130/83  Pulse: 70 97  Resp: 16 20  Temp: 98.4 F (36.9 C)   SpO2: 96% 99%      Antony MaduraHumes, Rajinder Mesick, PA-C 03/20/17 2317    Gwyneth SproutPlunkett, Whitney, MD 03/20/17 [phone]

## 2017-03-20 NOTE — ED Provider Notes (Signed)
[facility] EMERGENCY DEPARTMENT Provider Note   CSN: [id number] Arrival date & time: 03/20/17  1239     History   Chief Complaint Chief Complaint  Patient presents with  . Abdominal Pain    HPI Valerie Jarvis is a 41 y.o. female status post total hysterectomy presents today with chief complaint acute onset, progressively worsening right-sided abdominal pain for 1.5 weeks.  Patient states pain is worst in the right upper quadrant but also radiates down to the right lower quadrant and the other side of the abdomen.  Pain is described as a dull ache constantly but at times intensifies to a severe sharp pain "which will make you double over ".  Pain worsens with palpation.  She endorses nausea and vomiting and states that she has not been able to keep food down for the past several days.  Also endorses watery nonbloody stools.  Denies hematemesis.  Endorses fevers and chills, states she was febrile at 101 F at home. Has not tried anything for her symptoms.  Denies suspicious food intake or sick contacts but does endorse eating a diet of fast food and fried foods.  States that approximately 2 weeks ago she experienced some sinus pressure and pain but those symptoms have improved.  Endorses dysuria but denies hematuria, frequency, urgency.  Denies vaginal complaints.  The history is provided by the patient.    Past Medical History:  Diagnosis Date  . WUJWJXBJ(478.2)     Patient Active Problem List   Diagnosis Date Noted  . Tenosynovitis of finger 02/21/2013  . Cellulitis 02/18/2013  . Post op infection 10/11/2012  . Acquired trigger finger 09/11/2012  . Metacarpal bone fracture 07/30/2012    Past Surgical History:  Procedure Laterality Date  . ABDOMINAL HYSTERECTOMY  2002   [facility]    OB History    No data available       Home Medications    Prior to Admission medications   Medication Sig Start Date End Date Taking? Authorizing Provider    diphenhydrAMINE (BENADRYL) 25 MG tablet Take 25-100 mg See admin instructions by mouth. FOUR TO FIVE TIMES A DAY (in conjunction with each dose of 800 mg Ibuprofen/for perpetual migraines)   Yes [provider]  ibuprofen (ADVIL,MOTRIN) 200 MG tablet Take 800 mg See admin instructions by mouth. FOUR TO FIVE TIMES A DAY/for perpetual migraines   Yes [provider]  azithromycin (ZITHROMAX) 250 MG tablet 2 tablets day 1, 1 tablet day 2 through 5 Patient not taking: Reported on 03/20/2017 11/10/14   [name], MD  azithromycin [facility]) 250 MG tablet Two tabs day one:  One tab day two thru five Patient not taking: Reported on 03/20/2017 11/10/14   [name], MD    Family History Family History  Problem Relation Age of Onset  . Heart disease Unknown   . Arthritis Unknown   . Cancer Unknown   . Asthma Unknown   . Diabetes Unknown     Social History Social History   Tobacco Use  . Smoking status: Current Every Day Smoker    Packs/day: 2.00    Years: 27.00    Pack years: 54.00    Types: Cigarettes  . Smokeless tobacco: Never Used  Substance Use Topics  . Alcohol use: Yes    Comment: occaisonal use  . Drug use: Yes    Types: Marijuana    Comment: occasional     Allergies   Penicillins   Review of Systems  Review of Systems  Constitutional: Positive for chills and fever.  Respiratory: Negative for shortness of breath.   Cardiovascular: Negative for chest pain.  Gastrointestinal: Positive for abdominal pain, diarrhea, nausea and vomiting. Negative for blood in stool.  Genitourinary: Positive for dysuria. Negative for decreased urine volume and hematuria.  All other systems reviewed and are negative.    Physical Exam Updated Vital Signs BP 108/70 (BP Location: Left Arm)   Pulse 70   Temp 98.4 F (36.9 C) (Oral)   Resp 16   Wt 56.7 kg (125 lb)   SpO2 96%   BMI 23.62 kg/m   Physical Exam  Constitutional: She appears well-developed and  well-nourished.  Non-toxic appearance. She does not appear ill. No distress.  HENT:  Head: Normocephalic and atraumatic.  Eyes: Conjunctivae are normal. Right eye exhibits no discharge. Left eye exhibits no discharge.  Neck: No JVD present. No tracheal deviation present.  Cardiovascular: Normal rate.  Pulmonary/Chest: Effort normal. No respiratory distress. She exhibits no tenderness.  Abdominal: Soft. She exhibits no distension. Bowel sounds are increased. There is tenderness in the right upper quadrant, right lower quadrant, epigastric area and left upper quadrant. There is guarding and tenderness at McBurney's point. There is no rebound, no CVA tenderness and negative Murphy's sign.  Maximally tender to palpation in the right lower quadrant.  Musculoskeletal: She exhibits no edema.  Neurological: She is alert.  Skin: Skin is warm and dry. No erythema.  Psychiatric: She has a normal mood and affect. Her behavior is normal.  Nursing note and vitals reviewed.    ED Treatments / Results  Labs (all labs ordered are listed, but only abnormal results are displayed) Labs Reviewed  COMPREHENSIVE METABOLIC PANEL - Abnormal; Notable for the following components:      Result Value   ALT 13 (*)    All other components within normal limits  LIPASE, BLOOD  CBC  URINALYSIS, ROUTINE W REFLEX MICROSCOPIC    EKG  EKG Interpretation None       Radiology No results found.  Procedures Procedures (including critical care time)  Medications Ordered in ED Medications  sodium chloride 0.9 % bolus 1,000 mL (not administered)  ondansetron (ZOFRAN) injection 4 mg (not administered)  morphine 4 MG/ML injection 2 mg (not administered)     Initial Impression / Assessment and Plan / ED Course  I have reviewed the triage vital signs and the nursing notes.  Pertinent labs & imaging results that were available during my care of the patient were reviewed by me and considered in my medical  decision making (see chart for details).     Patient presents with abdominal pain, nausea, vomiting, diarrhea, and fevers.  Afebrile while in the ED, vital signs otherwise stable.  Maximally tender to palpation in the right lower quadrant, concern for diverticulitis versus appendicitis less likely.  Possible cholelithiasis but LFTs within normal limits.  Low suspicion of obstruction, perforation.  She is status post total hysterectomy, highly unlikely to be PID or other related GU pathology.  Lab work is reassuring.  UA not concerning for UTI or nephrolithiasis.  Will obtain CT scan to evaluate for diverticulitis versus appendicitis.  8:51 PM  Signed out to oncoming PA. Awaiting CT scan.  Patient likely stable for discharge home with outpatient follow-up, although if scan shows acute surgical pathology or severe infectious pathology she may require admission into the hospital for further workup and management.  She is in no apparent distress at this time. Final  Clinical Impressions(s) / ED Diagnoses   Final diagnoses:  None    ED Discharge Orders    None       Jeanie SewerFawze, Yulieth Carrender A, PA-C 03/21/17 1113    Gwyneth SproutPlunkett, Whitney, MD 03/21/17 2323

## 2017-03-20 NOTE — Discharge Instructions (Signed)
We advise Tylenol or ibuprofen for pain.  You may take this with Bentyl as needed for persistent pain or cramping.  Take Phenergan for nausea to prevent vomiting.  If your diarrhea continues, you may try over-the-counter Imodium to alleviate frequent stooling.  Follow-up with a primary care doctor for further evaluation of your symptoms.  Should you have persistent abdominal pain and desire specialist follow-up, [facility].  You may return to the emergency department, as needed, for new or concerning symptoms.

## 2017-03-21 NOTE — ED Notes (Signed)
Pt refused discharge vitals 

## 2017-12-13 ENCOUNTER — Emergency Department (HOSPITAL_COMMUNITY): Payer: Self-pay

## 2017-12-13 ENCOUNTER — Emergency Department (HOSPITAL_COMMUNITY)
Admission: EM | Admit: 2017-12-13 | Discharge: 2017-12-14 | Disposition: A | Discharge status: Home / Self Care | Payer: Self-pay | Attending: Emergency Medicine | Admitting: Emergency Medicine

## 2017-12-13 ENCOUNTER — Encounter (HOSPITAL_COMMUNITY): Payer: Self-pay | Admitting: *Deleted

## 2017-12-13 ENCOUNTER — Other Ambulatory Visit: Payer: Self-pay

## 2017-12-13 DIAGNOSIS — Y9389 Activity, other specified: Secondary | ICD-10-CM | POA: Insufficient documentation

## 2017-12-13 DIAGNOSIS — F17210 Nicotine dependence, cigarettes, uncomplicated: Secondary | ICD-10-CM | POA: Insufficient documentation

## 2017-12-13 DIAGNOSIS — Y9289 Other specified places as the place of occurrence of the external cause: Secondary | ICD-10-CM | POA: Insufficient documentation

## 2017-12-13 DIAGNOSIS — S20211A Contusion of right front wall of thorax, initial encounter: Secondary | ICD-10-CM | POA: Insufficient documentation

## 2017-12-13 DIAGNOSIS — W01198A Fall on same level from slipping, tripping and stumbling with subsequent striking against other object, initial encounter: Secondary | ICD-10-CM | POA: Insufficient documentation

## 2017-12-13 DIAGNOSIS — Z79899 Other long term (current) drug therapy: Secondary | ICD-10-CM | POA: Insufficient documentation

## 2017-12-13 DIAGNOSIS — Y999 Unspecified external cause status: Secondary | ICD-10-CM | POA: Insufficient documentation

## 2017-12-13 DIAGNOSIS — W19XXXA Unspecified fall, initial encounter: Secondary | ICD-10-CM

## 2017-12-13 MED ORDER — ONDANSETRON 4 MG PO TBDP
4.0000 mg | ORAL_TABLET | Freq: Once | ORAL | Status: AC
  Administered 2017-12-13: 4 mg via ORAL
  Filled 2017-12-13: qty 1

## 2017-12-13 MED ORDER — HYDROCODONE-ACETAMINOPHEN 5-325 MG PO TABS
1.0000 | ORAL_TABLET | Freq: Once | ORAL | Status: AC
  Administered 2017-12-13: 1 via ORAL
  Filled 2017-12-13: qty 1

## 2017-12-13 NOTE — ED Triage Notes (Signed)
Pt having right rib pain after landing on the bed of a tailgate; pt states it happened x 3 days ago and the pain has gotten worse; pt states it hurts to take a deep breath

## 2017-12-14 MED ORDER — IBUPROFEN 800 MG PO TABS: 800.0000 mg | ORAL_TABLET | Freq: Three times a day (TID) | ORAL | 0 refills | Status: AC

## 2017-12-14 MED ORDER — HYDROCODONE-ACETAMINOPHEN 5-325 MG PO TABS: 1.0000 | ORAL_TABLET | Freq: Four times a day (QID) | ORAL | 0 refills | Status: AC | PRN

## 2017-12-14 MED ORDER — PROMETHAZINE HCL 25 MG PO TABS: 25.0000 mg | ORAL_TABLET | Freq: Four times a day (QID) | ORAL | 0 refills | Status: AC | PRN

## 2017-12-14 MED ORDER — IBUPROFEN 800 MG PO TABS
800.0000 mg | ORAL_TABLET | Freq: Once | ORAL | Status: AC
  Administered 2017-12-14: 800 mg via ORAL
  Filled 2017-12-14: qty 1

## 2017-12-14 NOTE — ED Provider Notes (Signed)
[facility] EMERGENCY DEPARTMENT Provider Note   CSN: [id number] Arrival date & time: 12/13/17  2006     History   Chief Complaint Chief Complaint  Patient presents with  . Chest Pain    HPI Valerie Jarvis is a 42 y.o. female presenting with right rib cage pain which started 3 days ago when she fell against the edge of the tailgate of her pickup truck.  She reports instant pain which she felt was simply bruising, but has worsened over the past 2 days.  She reports pain with deep inspiration, certain movements and direct palpation.  She has had no treatments for this pain prior to arrival.  She denies increased shortness of breath, she does have a cough, but reports this is her chronic smoker's cough and not worsened since his injury.  She denies abdominal pain, nausea or vomiting.  She has found no alleviators for her symptoms.  The history is provided by the patient.    Past Medical History:  Diagnosis Date  . WUJWJXBJ(478.2)     Patient Active Problem List   Diagnosis Date Noted  . Tenosynovitis of finger 02/21/2013  . Cellulitis 02/18/2013  . Post op infection 10/11/2012  . Acquired trigger finger 09/11/2012  . Metacarpal bone fracture 07/30/2012    Past Surgical History:  Procedure Laterality Date  . ABDOMINAL HYSTERECTOMY  2002   [facility]  . INCISION AND DRAINAGE Right 11/26/2012   Procedure: INCISION AND DRAINAGE RIGHT LONG FINGER;  Surgeon: Vickki Hearing, MD;  Location: [facility];  Service: Orthopedics;  Laterality: Right;  Right Hand Incision and Drainage  . TRIGGER FINGER RELEASE Right 09/28/2012   Procedure: RELEASE TRIGGER FINGER RIGHT LONG FINGER;  Surgeon: Vickki Hearing, MD;  Location: [facility];  Service: Orthopedics;  Laterality: Right;  Right Long Finger     OB History   None      Home Medications    Prior to Admission medications   Medication Sig Start Date End Date Taking? Authorizing Provider  azithromycin (ZITHROMAX) 250 MG tablet 2  tablets day 1, 1 tablet day 2 through 5 Patient not taking: Reported on 03/20/2017 11/10/14   [name], MD  azithromycin [facility]) 250 MG tablet Two tabs day one:  One tab day two thru five Patient not taking: Reported on 03/20/2017 11/10/14   [name], MD  dicyclomine (BENTYL) 20 MG tablet Take 1 tablet (20 mg total) 2 (two) times daily as needed by mouth (abdominal pain/cramping). 03/20/17   Antony Madura, PA-C  diphenhydrAMINE (BENADRYL) 25 MG tablet Take 25-100 mg See admin instructions by mouth. FOUR TO FIVE TIMES A DAY (in conjunction with each dose of 800 mg Ibuprofen/for perpetual migraines)    [provider]  HYDROcodone-acetaminophen (NORCO/VICODIN) 5-325 MG tablet Take 1 tablet by mouth every 6 (six) hours as needed. 12/14/17   Burgess Amor, PA-C  ibuprofen (ADVIL,MOTRIN) 800 MG tablet Take 1 tablet (800 mg total) by mouth 3 (three) times daily. 12/14/17   Burgess Amor, PA-C  promethazine (PHENERGAN) 25 MG tablet Take 1 tablet (25 mg total) by mouth every 6 (six) hours as needed for nausea or vomiting. 12/14/17   Burgess Amor, PA-C    Family History Family History  Problem Relation Age of Onset  . Heart disease Unknown   . Arthritis Unknown   . Cancer Unknown   . Asthma Unknown   . Diabetes Unknown     Social History Social History   Tobacco Use  . Smoking  status: Current Every Day Smoker    Packs/day: 1.00    Years: 27.00    Pack years: 27.00    Types: Cigarettes  . Smokeless tobacco: Never Used  Substance Use Topics  . Alcohol use: Not Currently    Comment: occaisonal use  . Drug use: Yes    Types: Marijuana    Comment: occasional     Allergies   Penicillins   Review of Systems Review of Systems  Constitutional: Negative.   HENT: Negative.   Respiratory: Positive for cough. Negative for shortness of breath.   Cardiovascular: Negative for chest pain and leg swelling.  Gastrointestinal: Negative for abdominal pain, nausea and vomiting.  Genitourinary:  Negative for difficulty urinating, dysuria, flank pain, frequency and urgency.  Musculoskeletal: Positive for back pain. Negative for gait problem and joint swelling.  Skin: Negative for color change, rash and wound.  Neurological: Negative for weakness and numbness.     Physical Exam Updated Vital Signs BP 121/75   Pulse 71   Temp 97.8 F (36.6 C) (Oral)   Resp 19   Ht 5\' 1"  (1.549 m)   Wt 61.2 kg (135 lb)   SpO2 98%   BMI 25.51 kg/m   Physical Exam  Constitutional: She appears well-developed and well-nourished.  HENT:  Head: Normocephalic.  Eyes: Conjunctivae are normal.  Neck: Normal range of motion. Neck supple.  Cardiovascular: Normal rate and intact distal pulses.  Pedal pulses normal.  Pulmonary/Chest: Effort normal.  Abdominal: Soft. Bowel sounds are normal. She exhibits no distension and no mass.  Musculoskeletal: Normal range of motion. She exhibits no edema.       Lumbar back: She exhibits tenderness. She exhibits no swelling, no edema and no spasm.       Back:  Patient is point tender to palpation mid back, right with no palpable deformity, hematoma or bruising.  Neurological: She is alert. She has normal strength. She displays no atrophy and no tremor. No sensory deficit. Gait normal.  Reflex Scores:      Patellar reflexes are 2+ on the right side and 2+ on the left side.      Achilles reflexes are 2+ on the right side and 2+ on the left side. No strength deficit noted in hip and knee flexor and extensor muscle groups.  Ankle flexion and extension intact.  Skin: Skin is warm and dry.  Psychiatric: She has a normal mood and affect.  Nursing note and vitals reviewed.    ED Treatments / Results  Labs (all labs ordered are listed, but only abnormal results are displayed) Labs Reviewed - No data to display  EKG None  Radiology Dg Chest 2 View  Result Date: 12/13/2017 CLINICAL DATA:  Right rib pain for 3 days. Pain with breathing. Shortness of breath.  EXAM: CHEST - 2 VIEW COMPARISON:  Radiograph and CT 11/10/2014 FINDINGS: The cardiomediastinal contours are normal. Prior lingular opacity has resolved. Pulmonary vasculature is normal. No consolidation, pleural effusion, or pneumothorax. No acute osseous abnormalities are seen. No displaced rib fracture. IMPRESSION: Negative radiographs of the chest. Electronically Signed   By: Rubye OaksMelanie  Ehinger M.D.   On: 12/13/2017 21:12   Dg Ribs Unilateral Right  Result Date: 12/13/2017 CLINICAL DATA:  Fall with right-sided rib pain EXAM: RIGHT RIBS - 2 VIEW COMPARISON:  12/13/2017 FINDINGS: No fracture or other bone lesions are seen involving the ribs. IMPRESSION: Negative. Electronically Signed   By: Jasmine PangKim  Fujinaga M.D.   On: 12/13/2017 23:44  Procedures Procedures (including critical care time)  Medications Ordered in ED Medications  ibuprofen (ADVIL,MOTRIN) tablet 800 mg (has no administration in time range)  HYDROcodone-acetaminophen (NORCO/VICODIN) 5-325 MG per tablet 1 tablet (1 tablet Oral Given 12/13/17 2329)  ondansetron (ZOFRAN-ODT) disintegrating tablet 4 mg (4 mg Oral Given 12/13/17 2332)     Initial Impression / Assessment and Plan / ED Course  I have reviewed the triage vital signs and the nursing notes.  Pertinent labs & imaging results that were available during my care of the patient were reviewed by me and considered in my medical decision making (see chart for details).     Patient was given hydrocodone for her pain, she endorsed pain medications cause nausea so Zofran was also given.  She was also given an ibuprofen prior to discharge home.  X-rays were reviewed and discussed with patient.  Discussed home treatment including activity as tolerated, ice and heat therapy were discussed.  She was given incentive spirometer.  Strict return precautions were discussed.  [place] database was reviewed prior to discharge home.  PRN follow-up anticipated.  Final Clinical Impressions(s)  / ED Diagnoses   Final diagnoses:  Rib contusion, right, initial encounter    ED Discharge Orders        Ordered    HYDROcodone-acetaminophen (NORCO/VICODIN) 5-325 MG tablet  Every 6 hours PRN     12/14/17 0030    promethazine (PHENERGAN) 25 MG tablet  Every 6 hours PRN     12/14/17 0030    ibuprofen (ADVIL,MOTRIN) 800 MG tablet  3 times daily     12/14/17 0030       Burgess Amor, PA-C 12/14/17 0046    Benjiman Core, MD 12/20/17 [phone]

## 2017-12-14 NOTE — ED Notes (Addendum)
Incorrect documentation

## 2017-12-14 NOTE — Discharge Instructions (Addendum)
Your xrays are negative tonight as discussed.  I suspect you have a chest wall contusion.  You may alternate ice and heat therapy as discussed which may help your pain and healing.  You may take the hydrocodone prescribed for pain relief.  This will make you drowsy - do not drive within 4 hours of taking this medication.  You have also been prescribed phenergan to help prevent nausea with this pain medicine.  Use the incentive spirometer as instructed.  Avoid any activity that worsens your pain.

## 2018-08-06 IMAGING — DX DG CHEST 2V
2 series · 2 of 2 positions shown · non-contrast
Comparison: Radiograph and CT 11/10/2014

CLINICAL DATA: Right rib pain for 3 days. Pain with breathing.
Shortness of breath.

EXAM:
CHEST - 2 VIEW

[chest pa]
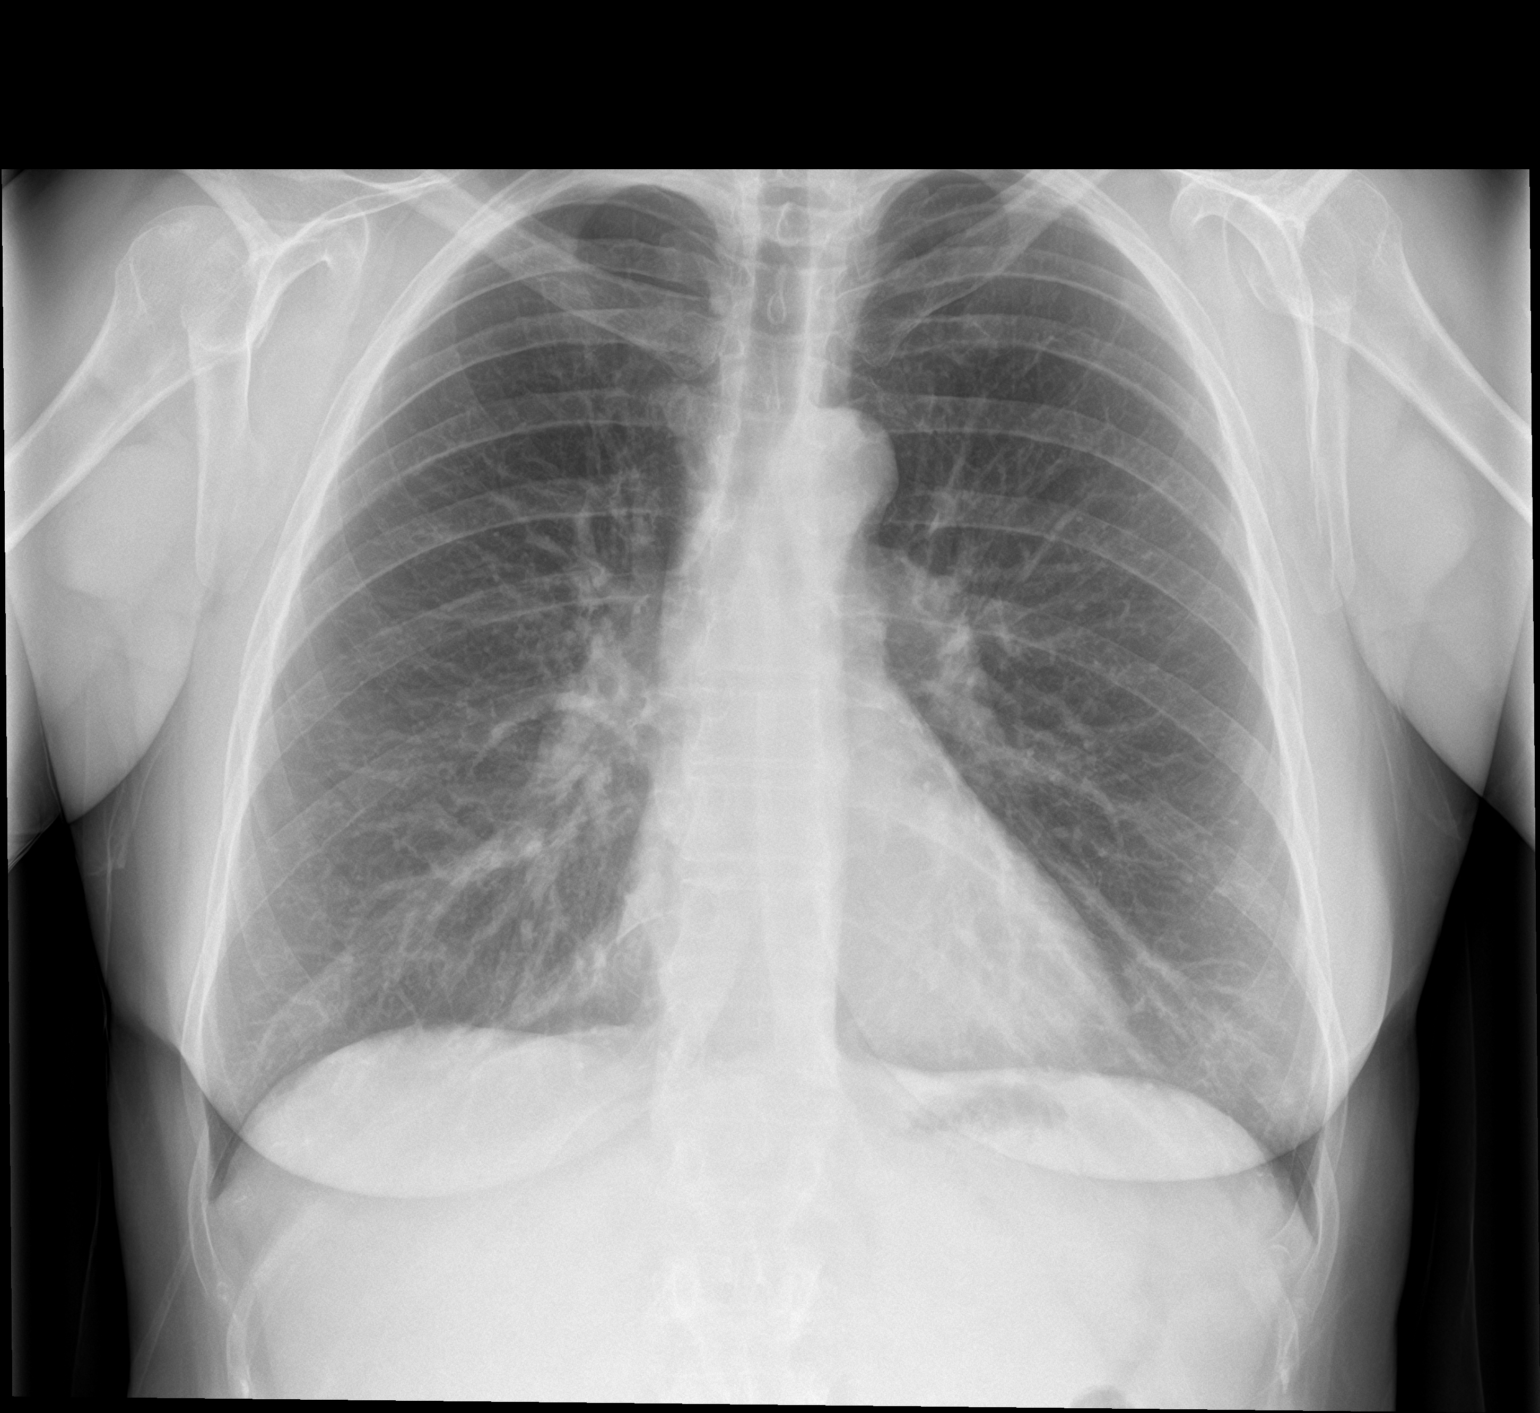

[chest lat]
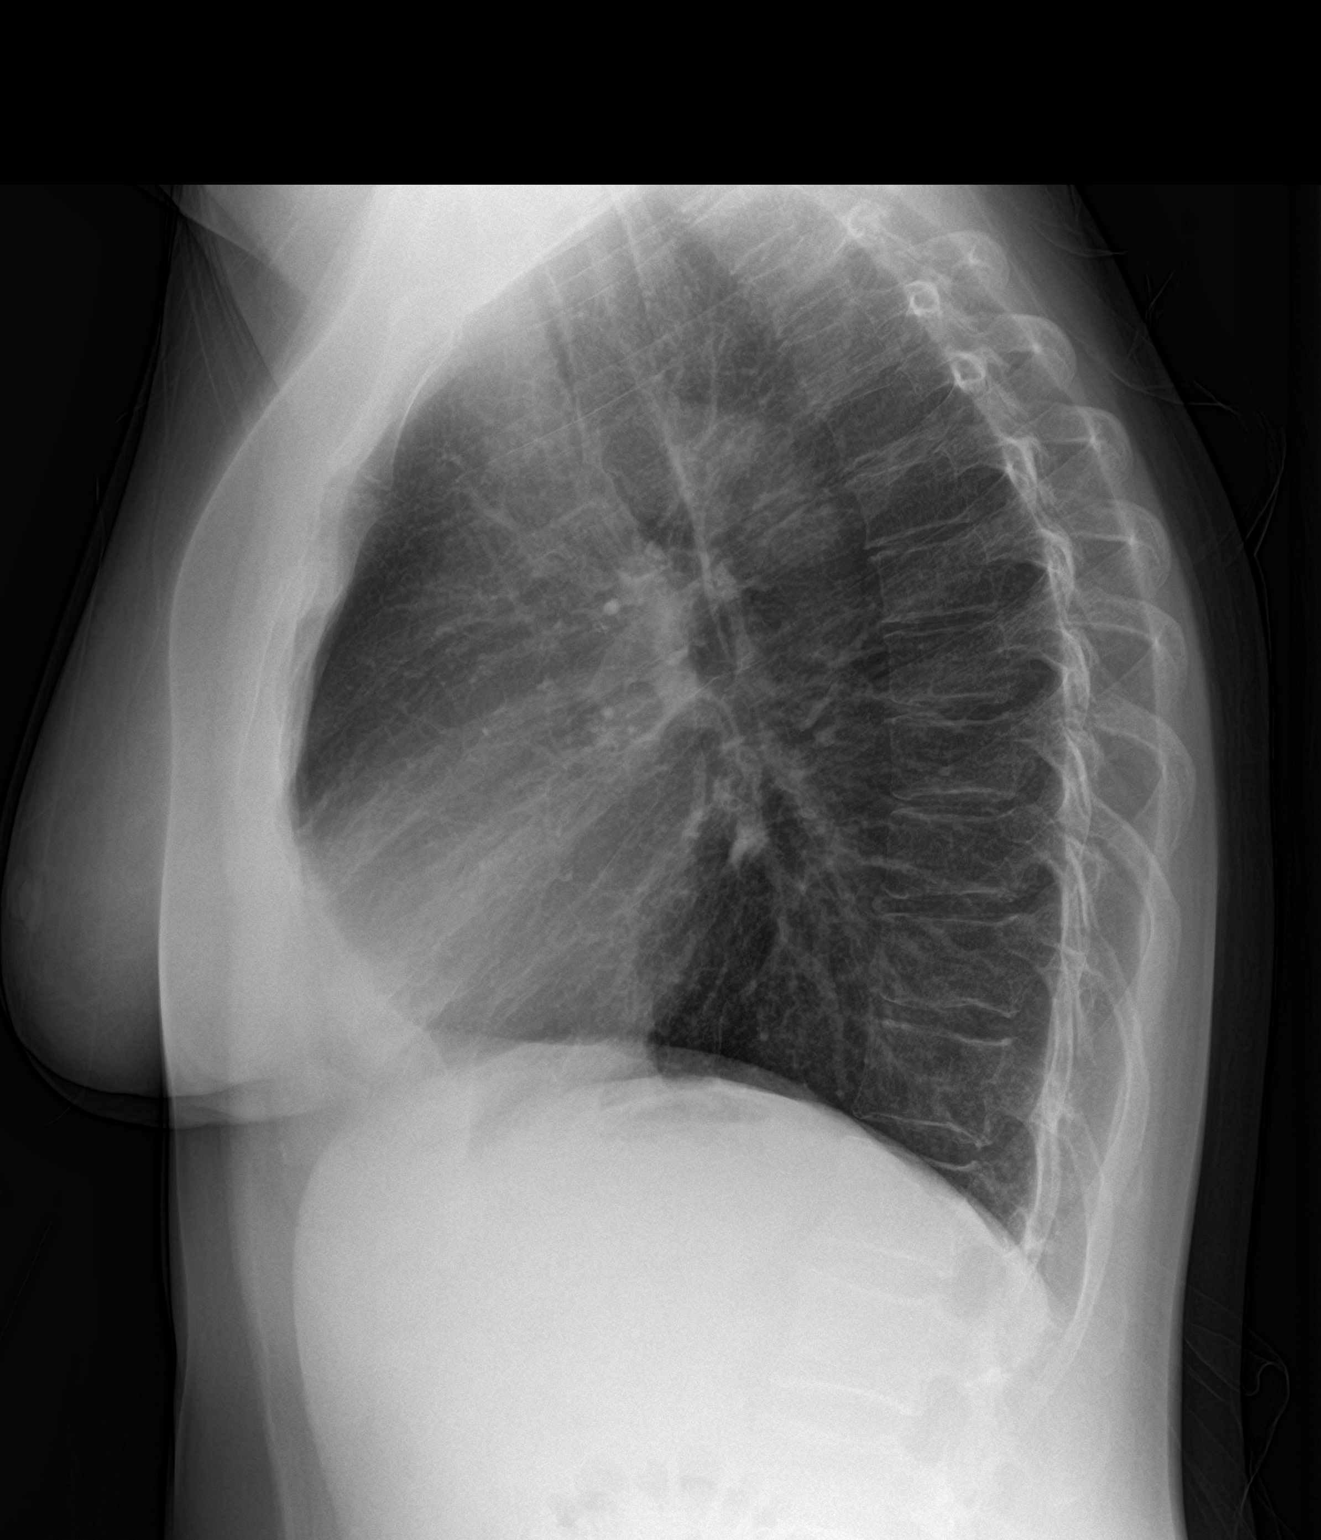

[2 of 2 positions shown; findings below may reference images not displayed]

FINDINGS: The cardiomediastinal contours are normal. Prior lingular opacity
has resolved. Pulmonary vasculature is normal. No consolidation,
pleural effusion, or pneumothorax. No acute osseous abnormalities
are seen. No displaced rib fracture.
IMPRESSION: Negative radiographs of the chest.

## 2018-08-06 IMAGING — DX DG RIBS 2V*R*
2 series · 2 of 2 positions shown · non-contrast
Comparison: 12/13/2017

CLINICAL DATA: Fall with right-sided rib pain

EXAM:
RIGHT RIBS - 2 VIEW

[rib pa]
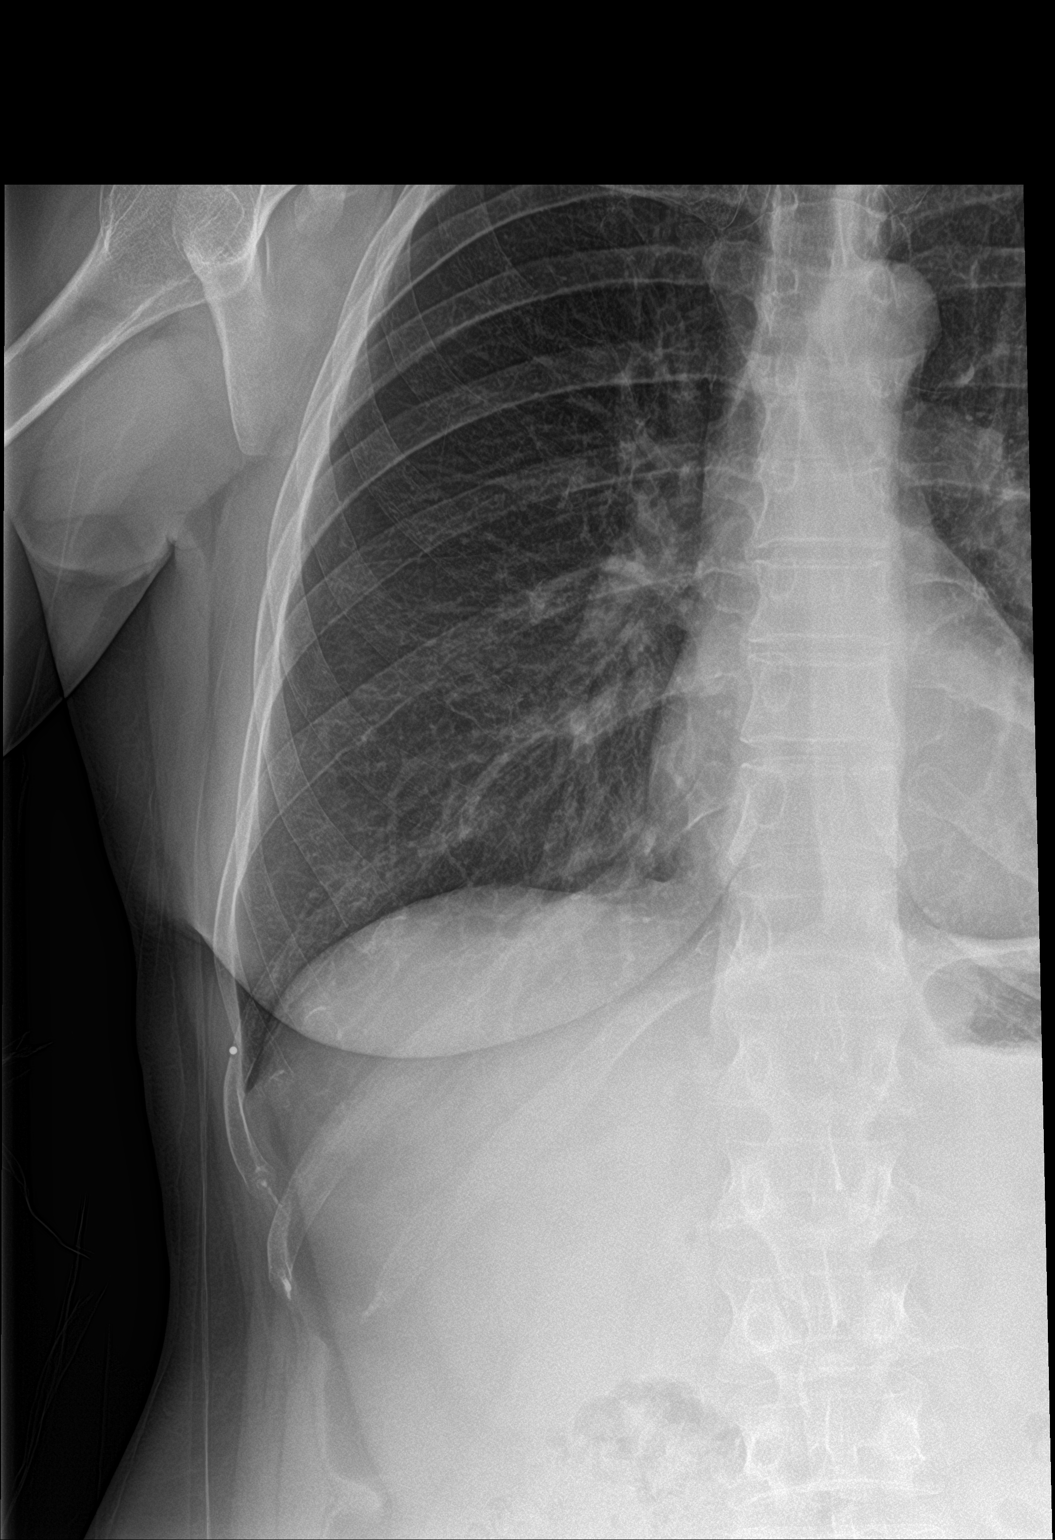

[rib pa obl]
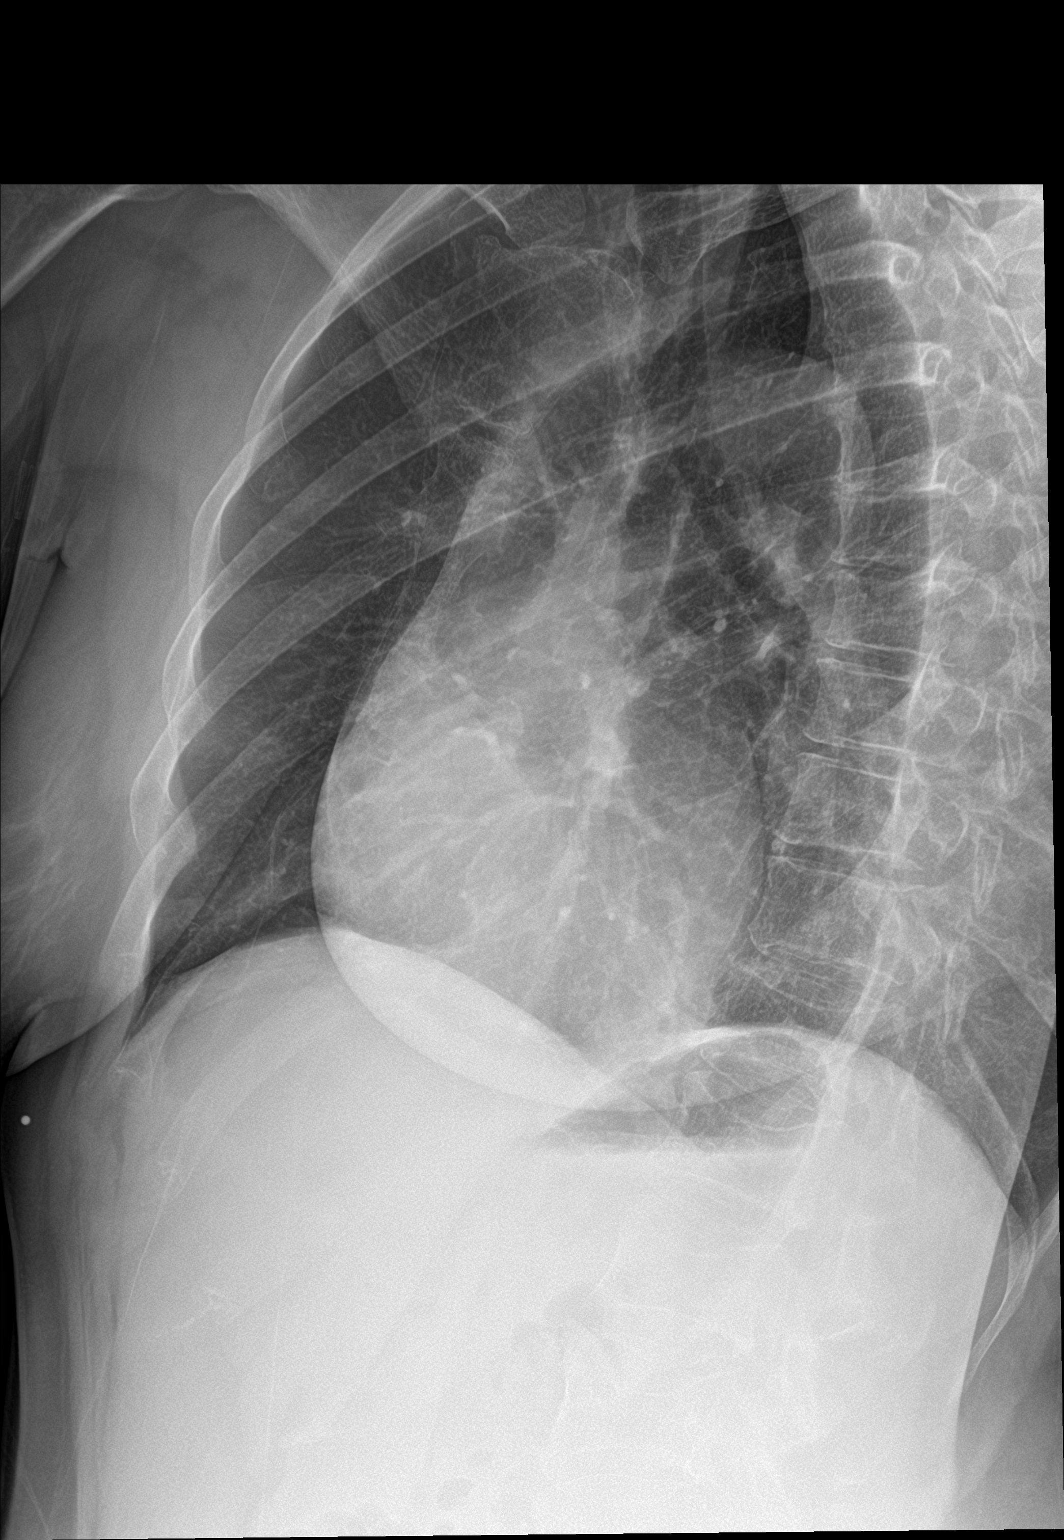

[2 of 2 positions shown; findings below may reference images not displayed]

FINDINGS: No fracture or other bone lesions are seen involving the ribs.
IMPRESSION: Negative.

## 2019-05-23 ENCOUNTER — Other Ambulatory Visit: Payer: Self-pay

## 2019-05-23 ENCOUNTER — Ambulatory Visit: Payer: HRSA Program | Attending: Internal Medicine

## 2019-05-23 DIAGNOSIS — Z20822 Contact with and (suspected) exposure to covid-19: Secondary | ICD-10-CM

## 2019-05-23 DIAGNOSIS — U071 COVID-19: Secondary | ICD-10-CM | POA: Diagnosis not present

## 2019-05-25 LAB — NOVEL CORONAVIRUS, NAA: SARS-CoV-2, NAA: DETECTED — AB

## 2020-06-02 ENCOUNTER — Ambulatory Visit: Payer: Self-pay | Admitting: Family Medicine

## 2020-08-13 ENCOUNTER — Other Ambulatory Visit: Payer: Self-pay | Admitting: Internal Medicine

## 2020-08-13 DIAGNOSIS — Z1231 Encounter for screening mammogram for malignant neoplasm of breast: Secondary | ICD-10-CM

## 2020-08-17 ENCOUNTER — Ambulatory Visit
Admission: RE | Admit: 2020-08-17 | Discharge: 2020-08-17 | Disposition: A | Discharge status: Home / Self Care | Payer: BC Managed Care – PPO | Source: Ambulatory Visit | Attending: Internal Medicine | Admitting: Internal Medicine

## 2020-08-17 ENCOUNTER — Other Ambulatory Visit: Payer: Self-pay

## 2020-08-17 DIAGNOSIS — Z1231 Encounter for screening mammogram for malignant neoplasm of breast: Secondary | ICD-10-CM
